# Patient Record
Sex: Female | Born: 1935 | Race: White | Hispanic: No | Smoking: Never smoker
Health system: Southern US, Community
[De-identification: ages and names within clinical notes are randomized; demographics above are authoritative.]

## PROBLEM LIST (undated history)

## (undated) DIAGNOSIS — E079 Disorder of thyroid, unspecified: Secondary | ICD-10-CM

## (undated) DIAGNOSIS — I639 Cerebral infarction, unspecified: Secondary | ICD-10-CM

## (undated) DIAGNOSIS — M1990 Unspecified osteoarthritis, unspecified site: Secondary | ICD-10-CM

## (undated) HISTORY — PX: ABDOMINAL HYSTERECTOMY: SHX81

---

## 1998-04-03 ENCOUNTER — Other Ambulatory Visit: Admission: RE | Admit: 1998-04-03 | Discharge: 1998-04-03 | Payer: Self-pay | Admitting: Obstetrics and Gynecology

## 2001-10-04 ENCOUNTER — Other Ambulatory Visit: Admission: RE | Admit: 2001-10-04 | Discharge: 2001-10-04 | Payer: Self-pay | Admitting: Obstetrics and Gynecology

## 2002-08-19 ENCOUNTER — Encounter: Admission: RE | Admit: 2002-08-19 | Discharge: 2002-08-19 | Payer: Self-pay | Admitting: Family Medicine

## 2002-08-19 ENCOUNTER — Encounter: Payer: Self-pay | Admitting: Family Medicine

## 2004-10-18 ENCOUNTER — Encounter: Admission: RE | Admit: 2004-10-18 | Discharge: 2004-10-18 | Payer: Self-pay | Admitting: Family Medicine

## 2004-11-19 ENCOUNTER — Encounter: Admission: RE | Admit: 2004-11-19 | Discharge: 2004-11-19 | Payer: Self-pay | Admitting: Family Medicine

## 2005-11-13 ENCOUNTER — Encounter: Admission: RE | Admit: 2005-11-13 | Discharge: 2005-11-13 | Payer: Self-pay | Admitting: Family Medicine

## 2005-12-04 ENCOUNTER — Encounter: Admission: RE | Admit: 2005-12-04 | Discharge: 2005-12-04 | Payer: Self-pay | Admitting: Family Medicine

## 2005-12-31 ENCOUNTER — Encounter: Admission: RE | Admit: 2005-12-31 | Discharge: 2005-12-31 | Payer: Self-pay | Admitting: Family Medicine

## 2006-05-28 ENCOUNTER — Encounter: Admission: RE | Admit: 2006-05-28 | Discharge: 2006-05-28 | Payer: Self-pay | Admitting: Family Medicine

## 2006-06-16 ENCOUNTER — Ambulatory Visit (HOSPITAL_COMMUNITY): Admission: RE | Admit: 2006-06-16 | Discharge: 2006-06-16 | Payer: Self-pay | Admitting: Family Medicine

## 2006-11-30 ENCOUNTER — Encounter: Admission: RE | Admit: 2006-11-30 | Discharge: 2006-11-30 | Payer: Self-pay | Admitting: Family Medicine

## 2016-01-13 ENCOUNTER — Emergency Department (HOSPITAL_COMMUNITY): Payer: Medicare Other

## 2016-01-13 ENCOUNTER — Encounter (HOSPITAL_COMMUNITY): Payer: Self-pay | Admitting: Emergency Medicine

## 2016-01-13 ENCOUNTER — Other Ambulatory Visit: Payer: Self-pay

## 2016-01-13 ENCOUNTER — Emergency Department (HOSPITAL_COMMUNITY)
Admission: EM | Admit: 2016-01-13 | Discharge: 2016-01-13 | Disposition: A | Discharge status: Home / Self Care | Payer: Medicare Other | Attending: Emergency Medicine | Admitting: Emergency Medicine

## 2016-01-13 DIAGNOSIS — Z79899 Other long term (current) drug therapy: Secondary | ICD-10-CM | POA: Insufficient documentation

## 2016-01-13 DIAGNOSIS — R079 Chest pain, unspecified: Secondary | ICD-10-CM | POA: Diagnosis not present

## 2016-01-13 HISTORY — DX: Disorder of thyroid, unspecified: E07.9

## 2016-01-13 HISTORY — DX: Unspecified osteoarthritis, unspecified site: M19.90

## 2016-01-13 LAB — BASIC METABOLIC PANEL
ANION GAP: 10 (ref 5–15)
BUN: 16 mg/dL (ref 6–20)
CALCIUM: 10 mg/dL (ref 8.9–10.3)
CHLORIDE: 103 mmol/L (ref 101–111)
CO2: 26 mmol/L (ref 22–32)
Creatinine, Ser: 1.16 mg/dL — ABNORMAL HIGH (ref 0.44–1.00)
GFR calc non Af Amer: 40 mL/min — ABNORMAL LOW (ref >60)
GFR, EST AFRICAN AMERICAN: 47 mL/min — AB (ref >60)
GLUCOSE: 121 mg/dL — AB (ref 65–99)
POTASSIUM: 4.2 mmol/L (ref 3.5–5.1)
Sodium: 139 mmol/L (ref 135–145)

## 2016-01-13 LAB — CBC
HEMATOCRIT: 44.4 % (ref 36.0–46.0)
HEMOGLOBIN: 14.7 g/dL (ref 12.0–15.0)
MCH: 32.2 pg (ref 26.0–34.0)
MCHC: 33.1 g/dL (ref 30.0–36.0)
MCV: 97.4 fL (ref 78.0–100.0)
Platelets: 317 10*3/uL (ref 150–400)
RBC: 4.56 MIL/uL (ref 3.87–5.11)
RDW: 13.4 % (ref 11.5–15.5)
WBC: 7.8 10*3/uL (ref 4.0–10.5)

## 2016-01-13 LAB — I-STAT TROPONIN, ED
TROPONIN I, POC: 0.01 ng/mL (ref 0.00–0.08)
Troponin i, poc: 0.01 ng/mL (ref 0.00–0.08)

## 2016-01-13 NOTE — ED Notes (Signed)
Patient given a mouth swab 

## 2016-01-13 NOTE — ED Notes (Signed)
PA at bedside.

## 2016-01-13 NOTE — Discharge Instructions (Signed)
It is very important that you keep your scheduled appointment with your primary care provider on Wednesday. Please let your doctor know about today's symptoms. Return to ER for shortness of breath, return of chest pain, nausea, abdominal pain, new or worsening symptoms, any additional concerns.

## 2016-01-13 NOTE — ED Provider Notes (Signed)
[facility]-EMERGENCY DEPT Provider Note   CSN: [id number] Arrival date & time: 01/13/16  [id number]  First Provider Contact:  First MD Initiated Contact with Patient 01/13/16 [phone]       History   Chief Complaint Chief Complaint  Patient presents with  . Chest Pain    HPI Lindsey Rivera is a 82 y.o. female.  Lindsey FetchDorothy C Cramer is a 82 y.o. female with a PMH of thyroid disease who presents to the Emergency Department complaining of central chest pain that began at 1am this morning. Patient reports that pain felt similar to her indigestion and she also experienced associated belching. Pain was intermittent, lasting a few seconds and occurring every 10-15 minutes. She has been pain free since approx. 3:30am. Denies shortness of breath, nausea, vomiting, abdominal pain, diaphoresis, or other associated symptoms. No medications taken prior to arrival for symptoms. No cardiac history, not a smoker.     The history is provided by the patient and medical records. No language interpreter was used.  Chest Pain   Pertinent negatives include no abdominal pain, no back pain, no cough, no diaphoresis, no fever, no headaches, no nausea, no palpitations, no shortness of breath and no vomiting.    Past Medical History:  Diagnosis Date  . Arthritis   . Thyroid disease     There are no active problems to display for this patient.   Past Surgical History:  Procedure Laterality Date  . ABDOMINAL HYSTERECTOMY      OB History    No data available       Home Medications    Prior to Admission medications   Medication Sig Start Date End Date Taking? Authorizing Provider  clorazepate (TRANXENE) 7.5 MG tablet Take 7.5 mg by mouth daily as needed for anxiety.   Yes Historical Provider, MD  FLUoxetine (PROZAC) 10 MG capsule Take 10 mg by mouth daily.   Yes Historical Provider, MD  levothyroxine (SYNTHROID, LEVOTHROID) 50 MCG tablet Take 50 mcg by mouth daily before breakfast.   Yes Historical Provider, MD    montelukast (SINGULAIR) 10 MG tablet Take 10 mg by mouth at bedtime.   Yes Historical Provider, MD  omega-3 acid ethyl esters (LOVAZA) 1 g capsule Take 1 g by mouth daily.   Yes Historical Provider, MD  OVER THE COUNTER MEDICATION Take 2 tablets by mouth daily. chelation   Yes Historical Provider, MD  propranolol (INDERAL) 10 MG tablet Take 10 mg by mouth daily as needed (hypertension).   Yes Historical Provider, MD  thiamine (VITAMIN B-1) 100 MG tablet Take 100 mg by mouth daily.   Yes Historical Provider, MD  TURMERIC PO Take 1 tablet by mouth daily.   Yes Historical Provider, MD    Family History History reviewed. No pertinent family history.  Social History Social History  Substance Use Topics  . Smoking status: Never Smoker  . Smokeless tobacco: Not on file  . Alcohol use No     Allergies   Darvon [propoxyphene] and Sulfa antibiotics   Review of Systems Review of Systems  Constitutional: Negative for diaphoresis and fever.  HENT: Negative for congestion.   Eyes: Negative for visual disturbance.  Respiratory: Negative for cough and shortness of breath.   Cardiovascular: Positive for chest pain. Negative for palpitations and leg swelling.  Gastrointestinal: Negative for abdominal pain, nausea and vomiting.  Genitourinary: Negative for dysuria.  Musculoskeletal: Negative for back pain and neck pain.  Skin: Negative for rash.  Neurological: Negative for headaches.  Physical Exam Updated Vital Signs BP 148/69   Pulse (!) 58   Temp 97.9 F (36.6 C) (Oral)   Resp 15   Ht  (1.575 m)   Wt 74.8 kg   SpO2 94%   BMI 30.18 kg/m   Physical Exam  Constitutional: She is oriented to person, place, and time. She appears well-developed and well-nourished. No distress.  HENT:  Head: Normocephalic and atraumatic.  Cardiovascular: Normal rate, regular rhythm, normal heart sounds and intact distal pulses.  Exam reveals no gallop and no friction rub.   No murmur  heard. Pulmonary/Chest: Effort normal and breath sounds normal. No respiratory distress. She has no wheezes. She has no rales. She exhibits no tenderness.  Abdominal: Soft. Bowel sounds are normal. She exhibits no distension. There is no tenderness.  Musculoskeletal: She exhibits no edema.  Neurological: She is alert and oriented to person, place, and time.  Skin: Skin is warm and dry.  Nursing note and vitals reviewed.    ED Treatments / Results  Labs (all labs ordered are listed, but only abnormal results are displayed) Labs Reviewed  BASIC METABOLIC PANEL - Abnormal; Notable for the following:       Result Value   Glucose, Bld 121 (*)    Creatinine, Ser 1.16 (*)    GFR calc non Af Amer 40 (*)    GFR calc Af Amer 47 (*)    All other components within normal limits  CBC  I-STAT TROPOININ, ED  I-STAT TROPOININ, ED    EKG  EKG Interpretation  Date/Time:  Sunday January 13 2016 08:02:00 EDT Ventricular Rate:  63 PR Interval:    QRS Duration: 81 QT Interval:  391 QTC Calculation: 401 R Axis:   -42 Text Interpretation:  Age not entered, assumed to be  82 years old for purpose of ECG interpretation Sinus rhythm Prolonged PR interval Left axis deviation Low voltage, precordial leads No significant change since last tracing Confirmed by ZACKOWSKI  MD, SCOTT [phone]) on 01/13/2016 8:04:48 AM       Radiology Dg Chest 2 View  Result Date: 01/13/2016 CLINICAL DATA:  82 year old female with chest pain EXAM: CHEST  2 VIEW COMPARISON:  Chest radiograph dated 12/29/2011 FINDINGS: Two views of the chest demonstrate emphysematous changes of the lungs with minimal bibasilar atelectatic changes. No focal consolidation, pleural effusion, or pneumothorax. The cardiac silhouette is within normal limits. No acute osseous pathology. IMPRESSION: No active cardiopulmonary disease. Electronically Signed   By: Elgie Collard M.D.   On: 01/13/2016 06:03    Procedures Procedures (including critical  care time)  Medications Ordered in ED Medications - No data to display   Initial Impression / Assessment and Plan / ED Course  I have reviewed the triage vital signs and the nursing notes.  Pertinent labs & imaging results that were available during my care of the patient were reviewed by me and considered in my medical decision making (see chart for details).  Clinical Course   Lindsey Rivera is a pleasant 82 y.o. female with PMH of thyroid disorder who presents to ER for chest pain that lasted a few seconds, coming-and-going every 10-15 minutes from 1am-3:30am this morning. No cardiac history, not a smoker, no HTN or DM. Initial EKG reviewed with Dr. Mora Bellman showing nonspecific changes - No prior EKG to compare. Initial trop negative. Labs reviewed. CXR with no acute cardiopulmonary disease. BP elevated -Not tachycardic or hypoxic. She appears very well and has been inquiring multiple times  throughout ED stay when she can go home. EKG was repeated with no significant change from previous. Informed patient that I would advise she stay for repeat troponin which she agrees too. Delta trop also unchanged. Results discussed with patient. HEART score of 4. Offered admission for chest pain rule out, however patient still requesting discharge to home. She has a PCP appointment already scheduled for Wednesday (3 days). Strongly encouraged patient to keep scheduled appointment. Patient given strict reasons to return to ED and voices understanding. Patient appears reliable for follow up and agrees to return to ED for further evaluation if chest pain returns, difficulty breathing, abdominal pain, n/v, back pain, new or worsening symptoms, any additional concerns.   Patient seen by and discussed with Dr. Eudelia Bunch who agrees with treatment plan.    Final Clinical Impressions(s) / ED Diagnoses   Final diagnoses:  Chest pain, unspecified chest pain type    New Prescriptions New Prescriptions   No  medications on file     [facility] Vernelle Wisner, PA-C 01/13/16 [address] [place], [place] 01/13/16 2112

## 2017-04-22 ENCOUNTER — Encounter (INDEPENDENT_AMBULATORY_CARE_PROVIDER_SITE_OTHER): Payer: Medicare Other | Admitting: Ophthalmology

## 2017-04-22 DIAGNOSIS — H353132 Nonexudative age-related macular degeneration, bilateral, intermediate dry stage: Secondary | ICD-10-CM

## 2017-04-22 DIAGNOSIS — H43813 Vitreous degeneration, bilateral: Secondary | ICD-10-CM | POA: Diagnosis not present

## 2017-05-06 ENCOUNTER — Encounter (INDEPENDENT_AMBULATORY_CARE_PROVIDER_SITE_OTHER): Payer: Medicare Other | Admitting: Ophthalmology

## 2017-05-06 DIAGNOSIS — H43813 Vitreous degeneration, bilateral: Secondary | ICD-10-CM

## 2017-05-06 DIAGNOSIS — H353132 Nonexudative age-related macular degeneration, bilateral, intermediate dry stage: Secondary | ICD-10-CM

## 2017-05-06 DIAGNOSIS — H4323 Crystalline deposits in vitreous body, bilateral: Secondary | ICD-10-CM | POA: Diagnosis not present

## 2017-06-04 ENCOUNTER — Emergency Department (HOSPITAL_COMMUNITY): Payer: No Typology Code available for payment source

## 2017-06-04 ENCOUNTER — Observation Stay (HOSPITAL_COMMUNITY): Payer: No Typology Code available for payment source

## 2017-06-04 ENCOUNTER — Other Ambulatory Visit (HOSPITAL_COMMUNITY): Payer: Medicare Other

## 2017-06-04 ENCOUNTER — Encounter (HOSPITAL_COMMUNITY): Payer: Self-pay | Admitting: Emergency Medicine

## 2017-06-04 ENCOUNTER — Inpatient Hospital Stay (HOSPITAL_COMMUNITY)
Admission: EM | Admit: 2017-06-04 | Discharge: 2017-06-06 | DRG: 064 | Disposition: A | Discharge status: Home Health | Payer: No Typology Code available for payment source | Attending: Internal Medicine | Admitting: Internal Medicine

## 2017-06-04 ENCOUNTER — Other Ambulatory Visit: Payer: Self-pay

## 2017-06-04 DIAGNOSIS — I129 Hypertensive chronic kidney disease with stage 1 through stage 4 chronic kidney disease, or unspecified chronic kidney disease: Secondary | ICD-10-CM | POA: Diagnosis present

## 2017-06-04 DIAGNOSIS — Z882 Allergy status to sulfonamides status: Secondary | ICD-10-CM

## 2017-06-04 DIAGNOSIS — R29700 NIHSS score 0: Secondary | ICD-10-CM | POA: Diagnosis present

## 2017-06-04 DIAGNOSIS — M4690 Unspecified inflammatory spondylopathy, site unspecified: Secondary | ICD-10-CM | POA: Diagnosis present

## 2017-06-04 DIAGNOSIS — Z90710 Acquired absence of both cervix and uterus: Secondary | ICD-10-CM

## 2017-06-04 DIAGNOSIS — N183 Chronic kidney disease, stage 3 unspecified: Secondary | ICD-10-CM | POA: Diagnosis present

## 2017-06-04 DIAGNOSIS — E039 Hypothyroidism, unspecified: Secondary | ICD-10-CM | POA: Diagnosis present

## 2017-06-04 DIAGNOSIS — E669 Obesity, unspecified: Secondary | ICD-10-CM | POA: Diagnosis present

## 2017-06-04 DIAGNOSIS — R4781 Slurred speech: Secondary | ICD-10-CM | POA: Diagnosis present

## 2017-06-04 DIAGNOSIS — R4182 Altered mental status, unspecified: Secondary | ICD-10-CM | POA: Diagnosis not present

## 2017-06-04 DIAGNOSIS — F0390 Unspecified dementia without behavioral disturbance: Secondary | ICD-10-CM | POA: Diagnosis present

## 2017-06-04 DIAGNOSIS — N179 Acute kidney failure, unspecified: Secondary | ICD-10-CM

## 2017-06-04 DIAGNOSIS — Z885 Allergy status to narcotic agent status: Secondary | ICD-10-CM

## 2017-06-04 DIAGNOSIS — G459 Transient cerebral ischemic attack, unspecified: Secondary | ICD-10-CM | POA: Diagnosis present

## 2017-06-04 DIAGNOSIS — Z6832 Body mass index (BMI) 32.0-32.9, adult: Secondary | ICD-10-CM

## 2017-06-04 DIAGNOSIS — M1990 Unspecified osteoarthritis, unspecified site: Secondary | ICD-10-CM | POA: Diagnosis present

## 2017-06-04 DIAGNOSIS — R402252 Coma scale, best verbal response, oriented, at arrival to emergency department: Secondary | ICD-10-CM | POA: Diagnosis present

## 2017-06-04 DIAGNOSIS — E785 Hyperlipidemia, unspecified: Secondary | ICD-10-CM | POA: Diagnosis present

## 2017-06-04 DIAGNOSIS — A419 Sepsis, unspecified organism: Secondary | ICD-10-CM

## 2017-06-04 DIAGNOSIS — Z79890 Hormone replacement therapy (postmenopausal): Secondary | ICD-10-CM

## 2017-06-04 DIAGNOSIS — I639 Cerebral infarction, unspecified: Secondary | ICD-10-CM | POA: Diagnosis not present

## 2017-06-04 DIAGNOSIS — S2231XA Fracture of one rib, right side, initial encounter for closed fracture: Secondary | ICD-10-CM | POA: Diagnosis present

## 2017-06-04 DIAGNOSIS — R402142 Coma scale, eyes open, spontaneous, at arrival to emergency department: Secondary | ICD-10-CM | POA: Diagnosis present

## 2017-06-04 DIAGNOSIS — F329 Major depressive disorder, single episode, unspecified: Secondary | ICD-10-CM | POA: Diagnosis present

## 2017-06-04 DIAGNOSIS — R29710 NIHSS score 10: Secondary | ICD-10-CM | POA: Diagnosis not present

## 2017-06-04 DIAGNOSIS — E079 Disorder of thyroid, unspecified: Secondary | ICD-10-CM | POA: Diagnosis present

## 2017-06-04 DIAGNOSIS — I63411 Cerebral infarction due to embolism of right middle cerebral artery: Secondary | ICD-10-CM | POA: Diagnosis not present

## 2017-06-04 DIAGNOSIS — G9341 Metabolic encephalopathy: Secondary | ICD-10-CM | POA: Diagnosis present

## 2017-06-04 DIAGNOSIS — R402362 Coma scale, best motor response, obeys commands, at arrival to emergency department: Secondary | ICD-10-CM | POA: Diagnosis present

## 2017-06-04 DIAGNOSIS — F419 Anxiety disorder, unspecified: Secondary | ICD-10-CM | POA: Diagnosis present

## 2017-06-04 LAB — I-STAT CHEM 8, ED
BUN: 13 mg/dL (ref 6–20)
CALCIUM ION: 1.22 mmol/L (ref 1.15–1.40)
CHLORIDE: 104 mmol/L (ref 101–111)
CREATININE: 0.9 mg/dL (ref 0.44–1.00)
GLUCOSE: 104 mg/dL — AB (ref 65–99)
HCT: 40 % (ref 36.0–46.0)
Hemoglobin: 13.6 g/dL (ref 12.0–15.0)
Potassium: 4 mmol/L (ref 3.5–5.1)
SODIUM: 145 mmol/L (ref 135–145)
TCO2: 27 mmol/L (ref 22–32)

## 2017-06-04 LAB — COMPREHENSIVE METABOLIC PANEL
ALK PHOS: 56 U/L (ref 38–126)
ALT: 17 U/L (ref 14–54)
ANION GAP: 10 (ref 5–15)
AST: 23 U/L (ref 15–41)
Albumin: 2.9 g/dL — ABNORMAL LOW (ref 3.5–5.0)
BILIRUBIN TOTAL: 0.5 mg/dL (ref 0.3–1.2)
BUN: 13 mg/dL (ref 6–20)
CALCIUM: 8.9 mg/dL (ref 8.9–10.3)
CO2: 25 mmol/L (ref 22–32)
CREATININE: 0.96 mg/dL (ref 0.44–1.00)
Chloride: 104 mmol/L (ref 101–111)
GFR calc non Af Amer: 51 mL/min — ABNORMAL LOW (ref >60)
GFR, EST AFRICAN AMERICAN: 59 mL/min — AB (ref >60)
Glucose, Bld: 109 mg/dL — ABNORMAL HIGH (ref 65–99)
Potassium: 3.9 mmol/L (ref 3.5–5.1)
SODIUM: 139 mmol/L (ref 135–145)
TOTAL PROTEIN: 6.5 g/dL (ref 6.5–8.1)

## 2017-06-04 LAB — URINALYSIS, ROUTINE W REFLEX MICROSCOPIC
BILIRUBIN URINE: NEGATIVE
Glucose, UA: NEGATIVE mg/dL
Hgb urine dipstick: NEGATIVE
KETONES UR: NEGATIVE mg/dL
LEUKOCYTES UA: NEGATIVE
NITRITE: NEGATIVE
Protein, ur: NEGATIVE mg/dL
SPECIFIC GRAVITY, URINE: 1.01 (ref 1.005–1.030)
pH: 5 (ref 5.0–8.0)

## 2017-06-04 LAB — I-STAT TROPONIN, ED: Troponin i, poc: 0.01 ng/mL (ref 0.00–0.08)

## 2017-06-04 LAB — CBC
HEMATOCRIT: 39.8 % (ref 36.0–46.0)
HEMOGLOBIN: 12.9 g/dL (ref 12.0–15.0)
MCH: 31.5 pg (ref 26.0–34.0)
MCHC: 32.4 g/dL (ref 30.0–36.0)
MCV: 97.1 fL (ref 78.0–100.0)
PLATELETS: 283 10*3/uL (ref 150–400)
RBC: 4.1 MIL/uL (ref 3.87–5.11)
RDW: 14 % (ref 11.5–15.5)
WBC: 9.8 10*3/uL (ref 4.0–10.5)

## 2017-06-04 LAB — DIFFERENTIAL
Basophils Absolute: 0.1 10*3/uL (ref 0.0–0.1)
Basophils Relative: 1 %
EOS PCT: 2 %
Eosinophils Absolute: 0.2 10*3/uL (ref 0.0–0.7)
LYMPHS ABS: 2 10*3/uL (ref 0.7–4.0)
LYMPHS PCT: 21 %
MONO ABS: 0.7 10*3/uL (ref 0.1–1.0)
MONOS PCT: 8 %
NEUTROS ABS: 6.7 10*3/uL (ref 1.7–7.7)
Neutrophils Relative %: 68 %

## 2017-06-04 LAB — RAPID URINE DRUG SCREEN, HOSP PERFORMED
AMPHETAMINES: NOT DETECTED
Barbiturates: NOT DETECTED
Benzodiazepines: POSITIVE — AB
Cocaine: NOT DETECTED
Opiates: NOT DETECTED
TETRAHYDROCANNABINOL: NOT DETECTED

## 2017-06-04 LAB — ETHANOL: Alcohol, Ethyl (B): <10 mg/dL (ref <10)

## 2017-06-04 LAB — PHOSPHORUS: PHOSPHORUS: 3.3 mg/dL (ref 2.5–4.6)

## 2017-06-04 LAB — MAGNESIUM: Magnesium: 2.1 mg/dL (ref 1.7–2.4)

## 2017-06-04 LAB — APTT: aPTT: 28 seconds (ref 24–36)

## 2017-06-04 LAB — PROTIME-INR
INR: 0.96
Prothrombin Time: 12.7 seconds (ref 11.4–15.2)

## 2017-06-04 LAB — TSH: TSH: 3.397 u[IU]/mL (ref 0.350–4.500)

## 2017-06-04 MED ORDER — ENOXAPARIN SODIUM 40 MG/0.4ML ~~LOC~~ SOLN
40.0000 mg | SUBCUTANEOUS | Status: DC
  Filled 2017-06-04: qty 0.4

## 2017-06-04 MED ORDER — TRAMADOL HCL 50 MG PO TABS: 50.0000 mg | ORAL_TABLET | Freq: Two times a day (BID) | ORAL | Status: DC | PRN

## 2017-06-04 MED ORDER — OMEGA-3-ACID ETHYL ESTERS 1 G PO CAPS: 1.0000 g | ORAL_CAPSULE | Freq: Every day | ORAL | Status: DC

## 2017-06-04 MED ORDER — ASPIRIN 325 MG PO TABS
325.0000 mg | ORAL_TABLET | Freq: Every day | ORAL | Status: DC
  Administered 2017-06-04 – 2017-06-06 (×3): 325 mg via ORAL
  Filled 2017-06-04 (×3): qty 1

## 2017-06-04 MED ORDER — ACETAMINOPHEN 650 MG RE SUPP: 650.0000 mg | Freq: Four times a day (QID) | RECTAL | Status: DC | PRN

## 2017-06-04 MED ORDER — ACETAMINOPHEN 325 MG PO TABS: 650.0000 mg | ORAL_TABLET | Freq: Four times a day (QID) | ORAL | Status: DC | PRN

## 2017-06-04 MED ORDER — ONDANSETRON HCL 4 MG/2ML IJ SOLN: 4.0000 mg | Freq: Four times a day (QID) | INTRAMUSCULAR | Status: DC | PRN

## 2017-06-04 MED ORDER — VITAMIN B-1 100 MG PO TABS: 100.0000 mg | ORAL_TABLET | Freq: Every day | ORAL | Status: DC

## 2017-06-04 MED ORDER — IOPAMIDOL (ISOVUE-370) INJECTION 76%
INTRAVENOUS | Status: AC
  Administered 2017-06-04: 50 mL
  Filled 2017-06-04: qty 50

## 2017-06-04 MED ORDER — KETOROLAC TROMETHAMINE 15 MG/ML IJ SOLN
15.0000 mg | Freq: Three times a day (TID) | INTRAMUSCULAR | Status: DC
  Administered 2017-06-04 – 2017-06-06 (×6): 15 mg via INTRAVENOUS
  Filled 2017-06-04 (×6): qty 1

## 2017-06-04 MED ORDER — ASPIRIN 300 MG RE SUPP: 300.0000 mg | Freq: Every day | RECTAL | Status: DC

## 2017-06-04 MED ORDER — LEVOTHYROXINE SODIUM 50 MCG PO TABS
50.0000 ug | ORAL_TABLET | Freq: Every day | ORAL | Status: DC
  Administered 2017-06-05 – 2017-06-06 (×2): 50 ug via ORAL
  Filled 2017-06-04 (×2): qty 1

## 2017-06-04 MED ORDER — MONTELUKAST SODIUM 10 MG PO TABS: 10.0000 mg | ORAL_TABLET | Freq: Every day | ORAL | Status: DC

## 2017-06-04 MED ORDER — FLUOXETINE HCL 10 MG PO CAPS
10.0000 mg | ORAL_CAPSULE | Freq: Every day | ORAL | Status: DC
  Administered 2017-06-05 – 2017-06-06 (×2): 10 mg via ORAL
  Filled 2017-06-04 (×2): qty 1

## 2017-06-04 MED ORDER — LIDOCAINE 5 % EX PTCH
1.0000 | MEDICATED_PATCH | CUTANEOUS | Status: DC
Start: 2017-06-04 — End: 2017-06-06
  Administered 2017-06-04 – 2017-06-05 (×2): 1 via TRANSDERMAL
  Filled 2017-06-04 (×2): qty 1

## 2017-06-04 MED ORDER — TRAMADOL HCL 50 MG PO TABS: 50.0000 mg | ORAL_TABLET | Freq: Two times a day (BID) | ORAL | Status: DC

## 2017-06-04 MED ORDER — PROPRANOLOL HCL 10 MG PO TABS: 10.0000 mg | ORAL_TABLET | Freq: Every day | ORAL | Status: DC | PRN

## 2017-06-04 MED ORDER — ONDANSETRON HCL 4 MG PO TABS: 4.0000 mg | ORAL_TABLET | Freq: Four times a day (QID) | ORAL | Status: DC | PRN

## 2017-06-04 MED ORDER — SODIUM CHLORIDE 0.9% FLUSH
3.0000 mL | Freq: Two times a day (BID) | INTRAVENOUS | Status: DC
  Administered 2017-06-04 – 2017-06-06 (×5): 3 mL via INTRAVENOUS

## 2017-06-04 MED ORDER — PANTOPRAZOLE SODIUM 40 MG IV SOLR
40.0000 mg | INTRAVENOUS | Status: DC
  Administered 2017-06-04: 40 mg via INTRAVENOUS
  Filled 2017-06-04: qty 40

## 2017-06-04 MED ORDER — STROKE: EARLY STAGES OF RECOVERY BOOK: Freq: Once | Status: DC
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
## 2017-06-04 NOTE — ED Notes (Signed)
Patient transported to MRI 

## 2017-06-04 NOTE — ED Notes (Signed)
Spoke with son -- Peyton NajjarLarry -- [phone]-
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
## 2017-06-04 NOTE — ED Notes (Signed)
Patient in Xray at this time.
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
## 2017-06-04 NOTE — Progress Notes (Signed)
Pt arrived to 3W16 via stretcher.  Pt alert and oriented, VSS.  Will continue to monitor.  Sondra ComeSilva, Rebbecca Osuna M, RN

## 2017-06-05 ENCOUNTER — Observation Stay (HOSPITAL_COMMUNITY): Payer: No Typology Code available for payment source

## 2017-06-05 ENCOUNTER — Other Ambulatory Visit: Payer: Self-pay | Admitting: Cardiology

## 2017-06-05 DIAGNOSIS — I129 Hypertensive chronic kidney disease with stage 1 through stage 4 chronic kidney disease, or unspecified chronic kidney disease: Secondary | ICD-10-CM | POA: Diagnosis present

## 2017-06-05 DIAGNOSIS — R29710 NIHSS score 10: Secondary | ICD-10-CM | POA: Diagnosis not present

## 2017-06-05 DIAGNOSIS — N183 Chronic kidney disease, stage 3 (moderate): Secondary | ICD-10-CM | POA: Diagnosis not present

## 2017-06-05 DIAGNOSIS — R402362 Coma scale, best motor response, obeys commands, at arrival to emergency department: Secondary | ICD-10-CM | POA: Diagnosis present

## 2017-06-05 DIAGNOSIS — G9341 Metabolic encephalopathy: Secondary | ICD-10-CM | POA: Diagnosis present

## 2017-06-05 DIAGNOSIS — M1990 Unspecified osteoarthritis, unspecified site: Secondary | ICD-10-CM

## 2017-06-05 DIAGNOSIS — Z885 Allergy status to narcotic agent status: Secondary | ICD-10-CM | POA: Diagnosis not present

## 2017-06-05 DIAGNOSIS — E079 Disorder of thyroid, unspecified: Secondary | ICD-10-CM | POA: Diagnosis not present

## 2017-06-05 DIAGNOSIS — F419 Anxiety disorder, unspecified: Secondary | ICD-10-CM | POA: Diagnosis present

## 2017-06-05 DIAGNOSIS — R29700 NIHSS score 0: Secondary | ICD-10-CM | POA: Diagnosis present

## 2017-06-05 DIAGNOSIS — I63511 Cerebral infarction due to unspecified occlusion or stenosis of right middle cerebral artery: Secondary | ICD-10-CM | POA: Diagnosis not present

## 2017-06-05 DIAGNOSIS — F0390 Unspecified dementia without behavioral disturbance: Secondary | ICD-10-CM | POA: Diagnosis present

## 2017-06-05 DIAGNOSIS — E039 Hypothyroidism, unspecified: Secondary | ICD-10-CM | POA: Diagnosis present

## 2017-06-05 DIAGNOSIS — M4690 Unspecified inflammatory spondylopathy, site unspecified: Secondary | ICD-10-CM | POA: Diagnosis present

## 2017-06-05 DIAGNOSIS — Z882 Allergy status to sulfonamides status: Secondary | ICD-10-CM | POA: Diagnosis not present

## 2017-06-05 DIAGNOSIS — S2231XA Fracture of one rib, right side, initial encounter for closed fracture: Secondary | ICD-10-CM | POA: Diagnosis present

## 2017-06-05 DIAGNOSIS — I63411 Cerebral infarction due to embolism of right middle cerebral artery: Secondary | ICD-10-CM | POA: Diagnosis present

## 2017-06-05 DIAGNOSIS — E785 Hyperlipidemia, unspecified: Secondary | ICD-10-CM | POA: Diagnosis not present

## 2017-06-05 DIAGNOSIS — G459 Transient cerebral ischemic attack, unspecified: Secondary | ICD-10-CM

## 2017-06-05 DIAGNOSIS — I340 Nonrheumatic mitral (valve) insufficiency: Secondary | ICD-10-CM

## 2017-06-05 DIAGNOSIS — E669 Obesity, unspecified: Secondary | ICD-10-CM | POA: Diagnosis present

## 2017-06-05 DIAGNOSIS — I639 Cerebral infarction, unspecified: Secondary | ICD-10-CM

## 2017-06-05 DIAGNOSIS — Z6832 Body mass index (BMI) 32.0-32.9, adult: Secondary | ICD-10-CM | POA: Diagnosis not present

## 2017-06-05 DIAGNOSIS — R402142 Coma scale, eyes open, spontaneous, at arrival to emergency department: Secondary | ICD-10-CM | POA: Diagnosis present

## 2017-06-05 DIAGNOSIS — R4781 Slurred speech: Secondary | ICD-10-CM | POA: Diagnosis present

## 2017-06-05 DIAGNOSIS — R4182 Altered mental status, unspecified: Secondary | ICD-10-CM | POA: Diagnosis present

## 2017-06-05 DIAGNOSIS — Z90710 Acquired absence of both cervix and uterus: Secondary | ICD-10-CM | POA: Diagnosis not present

## 2017-06-05 DIAGNOSIS — F329 Major depressive disorder, single episode, unspecified: Secondary | ICD-10-CM | POA: Diagnosis present

## 2017-06-05 DIAGNOSIS — R402252 Coma scale, best verbal response, oriented, at arrival to emergency department: Secondary | ICD-10-CM | POA: Diagnosis present

## 2017-06-05 DIAGNOSIS — Z79890 Hormone replacement therapy (postmenopausal): Secondary | ICD-10-CM | POA: Diagnosis not present

## 2017-06-05 LAB — COMPREHENSIVE METABOLIC PANEL
ALK PHOS: 51 U/L (ref 38–126)
ALT: 17 U/L (ref 14–54)
ANION GAP: 8 (ref 5–15)
AST: 25 U/L (ref 15–41)
Albumin: 2.8 g/dL — ABNORMAL LOW (ref 3.5–5.0)
BUN: 14 mg/dL (ref 6–20)
CALCIUM: 8.9 mg/dL (ref 8.9–10.3)
CHLORIDE: 104 mmol/L (ref 101–111)
CO2: 26 mmol/L (ref 22–32)
Creatinine, Ser: 1.22 mg/dL — ABNORMAL HIGH (ref 0.44–1.00)
GFR calc non Af Amer: 38 mL/min — ABNORMAL LOW (ref >60)
GFR, EST AFRICAN AMERICAN: 44 mL/min — AB (ref >60)
Glucose, Bld: 95 mg/dL (ref 65–99)
Potassium: 3.7 mmol/L (ref 3.5–5.1)
SODIUM: 138 mmol/L (ref 135–145)
Total Bilirubin: 0.7 mg/dL (ref 0.3–1.2)
Total Protein: 6 g/dL — ABNORMAL LOW (ref 6.5–8.1)

## 2017-06-05 LAB — CBC
HEMATOCRIT: 38.5 % (ref 36.0–46.0)
HEMOGLOBIN: 12.6 g/dL (ref 12.0–15.0)
MCH: 31.6 pg (ref 26.0–34.0)
MCHC: 32.7 g/dL (ref 30.0–36.0)
MCV: 96.5 fL (ref 78.0–100.0)
Platelets: 278 10*3/uL (ref 150–400)
RBC: 3.99 MIL/uL (ref 3.87–5.11)
RDW: 14 % (ref 11.5–15.5)
WBC: 7.9 10*3/uL (ref 4.0–10.5)

## 2017-06-05 LAB — LIPID PANEL
CHOL/HDL RATIO: 4.9 ratio
Cholesterol: 235 mg/dL — ABNORMAL HIGH (ref 0–200)
HDL: 48 mg/dL (ref >40)
LDL CALC: 165 mg/dL — AB (ref 0–99)
Triglycerides: 109 mg/dL (ref <150)
VLDL: 22 mg/dL (ref 0–40)

## 2017-06-05 LAB — ECHOCARDIOGRAM COMPLETE
HEIGHTINCHES: 60 in
WEIGHTICAEL: 2640 [oz_av]

## 2017-06-05 LAB — HEMOGLOBIN A1C
Hgb A1c MFr Bld: 5.7 % — ABNORMAL HIGH (ref 4.8–5.6)
MEAN PLASMA GLUCOSE: 116.89 mg/dL

## 2017-06-05 MED ORDER — ATORVASTATIN CALCIUM 40 MG PO TABS
40.0000 mg | ORAL_TABLET | Freq: Every day | ORAL | Status: DC
  Administered 2017-06-05: 40 mg via ORAL
  Filled 2017-06-05: qty 1

## 2017-06-05 MED ORDER — ENOXAPARIN SODIUM 30 MG/0.3ML ~~LOC~~ SOLN
30.0000 mg | SUBCUTANEOUS | Status: DC
  Administered 2017-06-05: 30 mg via SUBCUTANEOUS
  Filled 2017-06-05: qty 0.3

## 2017-06-05 MED ORDER — PANTOPRAZOLE SODIUM 40 MG PO TBEC
40.0000 mg | DELAYED_RELEASE_TABLET | Freq: Every day | ORAL | Status: DC
  Administered 2017-06-05 – 2017-06-06 (×2): 40 mg via ORAL
  Filled 2017-06-05 (×2): qty 1

## 2017-06-05 MED ORDER — CLOPIDOGREL BISULFATE 75 MG PO TABS
75.0000 mg | ORAL_TABLET | Freq: Every day | ORAL | Status: DC
  Administered 2017-06-05 – 2017-06-06 (×2): 75 mg via ORAL
  Filled 2017-06-05 (×2): qty 1
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
## 2017-06-05 NOTE — Progress Notes (Signed)
PT/OT recommending HH services. Offered the patient choice and [facility] selected. Cory with [facility] updated. [facility].  Pt states her daughter and son can provide 24/7 supervision at home. CM spoke to patients daughter, Bernita BuffyDianna, and she was in agreement that they are able to provide supervision at the patients home.  CM following.

## 2017-06-05 NOTE — Care Management Note (Signed)
Case Management Note  Patient Details  Name: Lindsey Rivera MRN: [id number] Date of Birth: 08/24/1924  Subjective/Objective:      Pt admitted with CVA. She is from home alone.               Action/Plan: PT recommending HH services. Awaiting OT eval. CM following for d/c needs, physician orders.   Expected Discharge Date:                  Expected Discharge Plan:  Home w Home Health Services  In-House Referral:     Discharge planning Services  CM Consult  Post Acute Care Choice:    Choice offered to:     DME Arranged:    DME Agency:     HH Arranged:    HH Agency:     Status of Service:  In process, will continue to follow  If discussed at Long Length of Stay Meetings, dates discussed:    Additional Comments:  Kermit BaloKelli F Dalene Robards, RN 06/05/2017, 11:07 AM

## 2017-06-05 NOTE — Progress Notes (Signed)
  Echocardiogram 2D Echocardiogram has been performed.  Lindsey Rivera Lindsey Rivera 06/05/2017, 1:51 PM
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
## 2017-06-05 NOTE — Progress Notes (Signed)
CONSULT NOTE  Pharmacy Consult for home medications with medication interactions   Allergies  Allergen Reactions  . Darvon [Propoxyphene] Other (See Comments)    unknown  . Sulfa Antibiotics Palpitations    Patient Measurements: Height: 5' (152.4 cm) Weight: 165 lb (74.8 kg) IBW/kg (Calculated) : 45.5   Vital Signs: Temp: 98 F (36.7 C) (12/28 0815) Temp Source: Oral (12/28 0815) BP: 133/55 (12/28 0815) Pulse Rate: 86 (12/28 0815)  Labs: Recent Labs    06/04/17 0945 06/04/17 1002 06/05/17 0417  HGB 12.9 13.6 12.6  HCT 39.8 40.0 38.5  PLT 283  --  278  APTT 28  --   --   LABPROT 12.7  --   --   INR 0.96  --   --   CREATININE 0.96 0.90 1.22*    Estimated Creatinine Clearance: 27.7 mL/min (A) (by C-G formula based on SCr of 1.22 mg/dL (H)).   Medications:  Medications Prior to Admission  Medication Sig Dispense Refill Last Dose  . calcium-vitamin D (OSCAL WITH D) 500-200 MG-UNIT tablet Take 1 tablet by mouth daily with breakfast.   06/04/2017 at Unknown   . clorazepate (TRANXENE) 7.5 MG tablet Take 7.5 mg by mouth daily as needed for anxiety.   06/04/2017 at Unknown   . FLUoxetine (PROZAC) 10 MG capsule Take 10 mg by mouth daily.   06/04/2017 at Unknown time  . Glucosamine-Chondroitin (OSTEO BI-FLEX REGULAR STRENGTH PO) Take 1 tablet by mouth daily.   Past Week at Unknown time  . levothyroxine (SYNTHROID, LEVOTHROID) 50 MCG tablet Take 50 mcg by mouth daily before breakfast.   06/04/2017 at Unknown time  . montelukast (SINGULAIR) 10 MG tablet Take 10 mg by mouth at bedtime.   06/04/2017 at Unknown   . Multiple Vitamins-Minerals (PRESERVISION AREDS PO) Take 1 tablet by mouth daily.   06/04/2017 at Unknown   . Omega-3 Fatty Acids (FISH OIL OMEGA-3 PO) Take 1 capsule by mouth.   06/04/2017 at Unknown time  . propranolol (INDERAL) 10 MG tablet Take 10 mg by mouth daily as needed (anxiety).   Past Week at Unknown time  . thiamine 100 MG tablet Take 100 mg by mouth  daily.   06/04/2017 at Unknown time  . timolol (BETIMOL) 0.5 % ophthalmic solution Place 1 drop into the left eye daily.   06/04/2017 at Unknown time  . TURMERIC PO Take 1 tablet by mouth daily.   06/04/2017 at Unknown time   Scheduled:  .  stroke: mapping our early stages of recovery book   Does not apply Once  . aspirin  300 mg Rectal Daily   Or  . aspirin  325 mg Oral Daily  . atorvastatin  40 mg Oral q1800  . enoxaparin (LOVENOX) injection  40 mg Subcutaneous Q24H  . FLUoxetine  10 mg Oral Daily  . ketorolac  15 mg Intravenous Q8H  . levothyroxine  50 mcg Oral QAC breakfast  . lidocaine  1 patch Transdermal Q24H  . pantoprazole  40 mg Oral Daily  . sodium chloride flush  3 mL Intravenous Q12H    Assessment: 83 yo female with CVA. Pharmacy was consulted to review the medication list with the patient and to look into drug interactions -The medication list has been updated after speaking with the patient (see the above list) -The following interactions were noted  Tumeric: may enhance the adverse/toxic effect of antiplatelet agents Synthroid: Separate the doses of the thyroid product and the oral calcium supplement by at least  4 hours    Plan:  -Consider restarting clorazepate, Singulair and vitamins as needed -Would avoid use of Tumeric  -Discussed with MD  Harland GermanAndrew Pablo Mathurin, Pharm D 06/05/2017 10:42 AM
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
## 2017-06-06 ENCOUNTER — Other Ambulatory Visit: Payer: Self-pay

## 2017-06-06 DIAGNOSIS — I63511 Cerebral infarction due to unspecified occlusion or stenosis of right middle cerebral artery: Secondary | ICD-10-CM

## 2017-06-06 DIAGNOSIS — I639 Cerebral infarction, unspecified: Secondary | ICD-10-CM

## 2017-06-06 MED ORDER — CLOPIDOGREL BISULFATE 75 MG PO TABS: 75.0000 mg | ORAL_TABLET | Freq: Every day | ORAL | 0 refills | Status: AC

## 2017-06-06 MED ORDER — ATORVASTATIN CALCIUM 40 MG PO TABS: 40.0000 mg | ORAL_TABLET | Freq: Every day | ORAL | 0 refills | Status: AC

## 2017-06-06 MED ORDER — ASPIRIN 325 MG PO TABS: 325.0000 mg | ORAL_TABLET | Freq: Every day | ORAL | 0 refills | Status: AC
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
## 2017-06-06 NOTE — Progress Notes (Signed)
Patient ready for discharge to home; discharge instructions given and reviewed;Rx's sent electronically. Patient dressed and assisted to the wheelchair; patient discharged out accompanied by her son and daughter-in-law.

## 2017-06-06 NOTE — Care Management (Addendum)
Sp[facility] with [facility] regarding [facility].  He does not feel pt needs PT, OT, or RN.  He does feel pt would benefit from SLP.  Cory with [facility] updated.  [facility]  PT eval will likely be Monday and SLP 1st visit will be Thursday.  Family updated.  Peyton NajjarLarry does not feel pt needs 3in1/shower chair.  Peyton NajjarLarry and wife will be with pt for 1 week, until PCP f/u.  He states they will decide at that point what further care she needs with PCP.

## 2017-06-08 ENCOUNTER — Encounter (HOSPITAL_COMMUNITY): Payer: Self-pay

## 2017-06-08 ENCOUNTER — Inpatient Hospital Stay (HOSPITAL_COMMUNITY)
Admission: EM | Admit: 2017-06-08 | Discharge: 2017-06-11 | DRG: 065 | Disposition: A | Discharge status: Skilled Nursing Facility | Payer: Medicare Other | Attending: Internal Medicine | Admitting: Internal Medicine

## 2017-06-08 ENCOUNTER — Emergency Department (HOSPITAL_COMMUNITY): Payer: Medicare Other

## 2017-06-08 DIAGNOSIS — R29710 NIHSS score 10: Secondary | ICD-10-CM | POA: Diagnosis present

## 2017-06-08 DIAGNOSIS — N183 Chronic kidney disease, stage 3 unspecified: Secondary | ICD-10-CM | POA: Diagnosis present

## 2017-06-08 DIAGNOSIS — I639 Cerebral infarction, unspecified: Secondary | ICD-10-CM | POA: Diagnosis present

## 2017-06-08 DIAGNOSIS — Z7982 Long term (current) use of aspirin: Secondary | ICD-10-CM

## 2017-06-08 DIAGNOSIS — R29810 Facial weakness: Secondary | ICD-10-CM | POA: Diagnosis present

## 2017-06-08 DIAGNOSIS — F419 Anxiety disorder, unspecified: Secondary | ICD-10-CM | POA: Diagnosis present

## 2017-06-08 DIAGNOSIS — I63411 Cerebral infarction due to embolism of right middle cerebral artery: Principal | ICD-10-CM | POA: Diagnosis present

## 2017-06-08 DIAGNOSIS — R531 Weakness: Secondary | ICD-10-CM

## 2017-06-08 DIAGNOSIS — H5340 Unspecified visual field defects: Secondary | ICD-10-CM | POA: Diagnosis present

## 2017-06-08 DIAGNOSIS — I6381 Other cerebral infarction due to occlusion or stenosis of small artery: Secondary | ICD-10-CM | POA: Diagnosis present

## 2017-06-08 DIAGNOSIS — R4701 Aphasia: Secondary | ICD-10-CM | POA: Diagnosis present

## 2017-06-08 DIAGNOSIS — Z66 Do not resuscitate: Secondary | ICD-10-CM | POA: Diagnosis present

## 2017-06-08 DIAGNOSIS — G8194 Hemiplegia, unspecified affecting left nondominant side: Secondary | ICD-10-CM | POA: Diagnosis present

## 2017-06-08 DIAGNOSIS — R414 Neurologic neglect syndrome: Secondary | ICD-10-CM | POA: Diagnosis present

## 2017-06-08 DIAGNOSIS — G460 Middle cerebral artery syndrome: Secondary | ICD-10-CM | POA: Diagnosis present

## 2017-06-08 DIAGNOSIS — I10 Essential (primary) hypertension: Secondary | ICD-10-CM | POA: Diagnosis present

## 2017-06-08 DIAGNOSIS — I671 Cerebral aneurysm, nonruptured: Secondary | ICD-10-CM | POA: Diagnosis present

## 2017-06-08 DIAGNOSIS — I672 Cerebral atherosclerosis: Secondary | ICD-10-CM | POA: Diagnosis present

## 2017-06-08 DIAGNOSIS — R1310 Dysphagia, unspecified: Secondary | ICD-10-CM | POA: Diagnosis present

## 2017-06-08 DIAGNOSIS — I951 Orthostatic hypotension: Secondary | ICD-10-CM | POA: Diagnosis present

## 2017-06-08 DIAGNOSIS — F329 Major depressive disorder, single episode, unspecified: Secondary | ICD-10-CM | POA: Diagnosis present

## 2017-06-08 DIAGNOSIS — G9340 Encephalopathy, unspecified: Secondary | ICD-10-CM | POA: Diagnosis present

## 2017-06-08 DIAGNOSIS — I6622 Occlusion and stenosis of left posterior cerebral artery: Secondary | ICD-10-CM | POA: Diagnosis present

## 2017-06-08 DIAGNOSIS — E039 Hypothyroidism, unspecified: Secondary | ICD-10-CM | POA: Diagnosis present

## 2017-06-08 DIAGNOSIS — R410 Disorientation, unspecified: Secondary | ICD-10-CM

## 2017-06-08 DIAGNOSIS — I129 Hypertensive chronic kidney disease with stage 1 through stage 4 chronic kidney disease, or unspecified chronic kidney disease: Secondary | ICD-10-CM | POA: Diagnosis present

## 2017-06-08 DIAGNOSIS — Z7902 Long term (current) use of antithrombotics/antiplatelets: Secondary | ICD-10-CM

## 2017-06-08 DIAGNOSIS — Z79890 Hormone replacement therapy (postmenopausal): Secondary | ICD-10-CM

## 2017-06-08 HISTORY — DX: Cerebral infarction, unspecified: I63.9

## 2017-06-08 LAB — DIFFERENTIAL
Basophils Absolute: 0 10*3/uL (ref 0.0–0.1)
Basophils Relative: 0 %
EOS ABS: 0.2 10*3/uL (ref 0.0–0.7)
EOS PCT: 2 %
LYMPHS ABS: 2.6 10*3/uL (ref 0.7–4.0)
Lymphocytes Relative: 27 %
MONOS PCT: 16 %
Monocytes Absolute: 1.5 10*3/uL — ABNORMAL HIGH (ref 0.1–1.0)
Neutro Abs: 5.1 10*3/uL (ref 1.7–7.7)
Neutrophils Relative %: 55 %

## 2017-06-08 LAB — I-STAT CHEM 8, ED
BUN: 30 mg/dL — AB (ref 6–20)
CALCIUM ION: 1.22 mmol/L (ref 1.15–1.40)
CHLORIDE: 104 mmol/L (ref 101–111)
Creatinine, Ser: 1.2 mg/dL — ABNORMAL HIGH (ref 0.44–1.00)
GLUCOSE: 112 mg/dL — AB (ref 65–99)
HCT: 37 % (ref 36.0–46.0)
Hemoglobin: 12.6 g/dL (ref 12.0–15.0)
Potassium: 4 mmol/L (ref 3.5–5.1)
Sodium: 140 mmol/L (ref 135–145)
TCO2: 26 mmol/L (ref 22–32)

## 2017-06-08 LAB — COMPREHENSIVE METABOLIC PANEL
ALT: 22 U/L (ref 14–54)
ANION GAP: 7 (ref 5–15)
AST: 28 U/L (ref 15–41)
Albumin: 3.1 g/dL — ABNORMAL LOW (ref 3.5–5.0)
Alkaline Phosphatase: 54 U/L (ref 38–126)
BILIRUBIN TOTAL: 1 mg/dL (ref 0.3–1.2)
BUN: 30 mg/dL — AB (ref 6–20)
CO2: 27 mmol/L (ref 22–32)
Calcium: 9.1 mg/dL (ref 8.9–10.3)
Chloride: 105 mmol/L (ref 101–111)
Creatinine, Ser: 1.23 mg/dL — ABNORMAL HIGH (ref 0.44–1.00)
GFR calc Af Amer: 43 mL/min — ABNORMAL LOW (ref >60)
GFR, EST NON AFRICAN AMERICAN: 37 mL/min — AB (ref >60)
Glucose, Bld: 116 mg/dL — ABNORMAL HIGH (ref 65–99)
POTASSIUM: 4 mmol/L (ref 3.5–5.1)
Sodium: 139 mmol/L (ref 135–145)
TOTAL PROTEIN: 6.9 g/dL (ref 6.5–8.1)

## 2017-06-08 LAB — CBG MONITORING, ED: Glucose-Capillary: 100 mg/dL — ABNORMAL HIGH (ref 65–99)

## 2017-06-08 LAB — CBC
HEMATOCRIT: 40.3 % (ref 36.0–46.0)
HEMOGLOBIN: 13.6 g/dL (ref 12.0–15.0)
MCH: 32.2 pg (ref 26.0–34.0)
MCHC: 33.7 g/dL (ref 30.0–36.0)
MCV: 95.5 fL (ref 78.0–100.0)
Platelets: 274 10*3/uL (ref 150–400)
RBC: 4.22 MIL/uL (ref 3.87–5.11)
RDW: 13.6 % (ref 11.5–15.5)
WBC: 9.4 10*3/uL (ref 4.0–10.5)

## 2017-06-08 LAB — I-STAT TROPONIN, ED: TROPONIN I, POC: 0.04 ng/mL (ref 0.00–0.08)

## 2017-06-08 LAB — PROTIME-INR
INR: 0.97
Prothrombin Time: 12.8 seconds (ref 11.4–15.2)

## 2017-06-08 LAB — APTT: aPTT: 22 seconds — ABNORMAL LOW (ref 24–36)

## 2017-06-08 MED ORDER — IOPAMIDOL (ISOVUE-370) INJECTION 76%
INTRAVENOUS | Status: AC
  Filled 2017-06-08: qty 100

## 2017-06-08 MED ORDER — IOPAMIDOL (ISOVUE-370) INJECTION 76%
60.0000 mL | Freq: Once | INTRAVENOUS | Status: AC | PRN
  Administered 2017-06-08: 60 mL via INTRAVENOUS

## 2017-06-08 NOTE — ED Notes (Signed)
Telestroke RN signing off

## 2017-06-08 NOTE — ED Notes (Signed)
Bed: [id number] Expected date:  Expected time:  Means of arrival:  Comments: EMS 83 yo female weakness hx stroke
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
## 2017-06-08 NOTE — Consult Note (Addendum)
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 She is not a candidate for NIR for the R M1 segment as this process started well over 24 hours ago. However, if she has a new clot in the left MCA territory, she may be a NIR candidate. Recommend repeat CTA/P head/neck. - - -   Not a tpa candidate due to: LKN >4.5 hours Presentation is not suggestive of Large Vessel Occlusive Disease. Thrombectomy would not be recommended.   Differential Diagnosis:   1. Cardioembolic stroke  2. Small vessel disease/lacune  3. Thromboembolic, artery-to-artery mechanism  4. Hypercoagulable state-related infarct  5. Transient ischemic attack  6. Thrombotic mechanism, large artery disease   Comments:   Door time: 2032 [facility] contacted: [phone] [facility] at bedside: 2217 NIHSS assessment time: 2234  Recommendations:  CTA/P head/neck if neg for new findings, recommend admission for evolving stroke management. Continue ASA/plavix DVT proph - lovenox permissive htn PT/OT/speech bedside swallow eval  Inpatient neurology consultation Inpatient stroke evaluation as per Neurology/ Internal Medicine Discussed with ED MD Please call with questions  -----------------------------------------------------------------------------------------  CC L sided weakness.  History of Present Illness   Patient is a 83 yo woman who was admitted last week for an acute R BG and R posterior frontal lobe infarcts. She was was discharged yesterday. This evening, her son noted difficulty speaking and left facial droop. She was discharged on plavix and ASA but refused the plavix tonight. She was LSN at approx 1600. No LOC/convulsion. After the stroke last week, she had residual left sided  weakness, but it is worse today.   Diagnostic: MRI shows R BG infarct; CTA head/neck shows a proximal R M1 occlusion vs severe stenosis.   Exam:  RESULT SUMMARY: 9 points NIH Stroke Scale   INPUTS: 1A: Level of consciousness -> 1 = Arouses to minor stimulation 1B: Ask month and age -> 2 = 0 questions right  1C: 'Blink eyes' & 'squeeze hands' -> 0 = Performs both tasks 2: Horizontal extraocular movements -> 1 = Partial gaze palsy: can be overcome 3: Visual fields -> 0 = No visual loss 4: Facial palsy -> 0 = Normal symmetry 5A: Left arm motor drift -> 0 = No drift for 10 seconds 5B: Right arm motor drift -> 0 = No drift for 10 seconds 6A: Left leg motor drift -> 1 = Drift, but doesn't hit bed 6B: Right leg motor drift -> 0 = No drift for 5 seconds 7: Limb Ataxia -> 0 = No ataxia 8: Sensation -> 1 = Mild-moderate loss: less sharp/more dull  9: Language/aphasia -> 2 = Severe aphasia: fragmentary expression, inference needed, cannot identify materials 10: Dysarthria -> 1 = Mild-moderate dysarthria: slurring but can be understood 11: Extinction/inattention -> 0 = No abnormality  Medical Decision Making:  - Extensive number of diagnosis or management options are considered above.   - Extensive amount of complex data reviewed.   - High risk of complication and/or morbidity or mortality are associated with differential diagnostic considerations above.  - There may be Uncertain outcome and increased probability of prolonged functional impairment or high probability of severe prolonged functional impairment associated with some of these differential diagnosis.  Medical Data Reviewed:  1.Data reviewed include clinical labs, radiology,  Medical Tests;   2.Tests  results discussed w/performing or interpreting physician;   3.Obtaining/reviewing old medical records;  4.Obtaining case history from another source;  5.Independent review of image, tracing or specimen.    Patient was informed the  Neurology Consult would happen via telehealth (remote video) and consented to receiving care in this manner.

## 2017-06-08 NOTE — ED Notes (Signed)
Patient attempted to use bedpan but was unsuccessful due to difficulty with following directions. Purewick to be placed for patient.

## 2017-06-08 NOTE — ED Notes (Signed)
Pt son and dtr in law arrived and are bedside. Pt Son reports that pt went to bed around 4pm and woke up around 6 pm and pt was increased confusion, could not mover per pt son, and had facial drooping to left side of face.
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
## 2017-06-08 NOTE — ED Notes (Signed)
Patient transported to CT 

## 2017-06-09 ENCOUNTER — Other Ambulatory Visit: Payer: Self-pay

## 2017-06-09 DIAGNOSIS — F419 Anxiety disorder, unspecified: Secondary | ICD-10-CM

## 2017-06-09 DIAGNOSIS — R29810 Facial weakness: Secondary | ICD-10-CM | POA: Diagnosis present

## 2017-06-09 DIAGNOSIS — I63411 Cerebral infarction due to embolism of right middle cerebral artery: Secondary | ICD-10-CM | POA: Diagnosis present

## 2017-06-09 DIAGNOSIS — R1310 Dysphagia, unspecified: Secondary | ICD-10-CM | POA: Diagnosis present

## 2017-06-09 DIAGNOSIS — I10 Essential (primary) hypertension: Secondary | ICD-10-CM

## 2017-06-09 DIAGNOSIS — I671 Cerebral aneurysm, nonruptured: Secondary | ICD-10-CM | POA: Diagnosis present

## 2017-06-09 DIAGNOSIS — I639 Cerebral infarction, unspecified: Secondary | ICD-10-CM

## 2017-06-09 DIAGNOSIS — R4701 Aphasia: Secondary | ICD-10-CM | POA: Diagnosis present

## 2017-06-09 DIAGNOSIS — G8194 Hemiplegia, unspecified affecting left nondominant side: Secondary | ICD-10-CM

## 2017-06-09 DIAGNOSIS — R414 Neurologic neglect syndrome: Secondary | ICD-10-CM | POA: Diagnosis present

## 2017-06-09 DIAGNOSIS — R531 Weakness: Secondary | ICD-10-CM | POA: Diagnosis present

## 2017-06-09 DIAGNOSIS — G9340 Encephalopathy, unspecified: Secondary | ICD-10-CM | POA: Diagnosis present

## 2017-06-09 DIAGNOSIS — N183 Chronic kidney disease, stage 3 (moderate): Secondary | ICD-10-CM | POA: Diagnosis present

## 2017-06-09 DIAGNOSIS — E038 Other specified hypothyroidism: Secondary | ICD-10-CM

## 2017-06-09 DIAGNOSIS — I6622 Occlusion and stenosis of left posterior cerebral artery: Secondary | ICD-10-CM | POA: Diagnosis present

## 2017-06-09 DIAGNOSIS — E039 Hypothyroidism, unspecified: Secondary | ICD-10-CM | POA: Diagnosis present

## 2017-06-09 DIAGNOSIS — R410 Disorientation, unspecified: Secondary | ICD-10-CM | POA: Diagnosis not present

## 2017-06-09 DIAGNOSIS — H5340 Unspecified visual field defects: Secondary | ICD-10-CM | POA: Diagnosis present

## 2017-06-09 DIAGNOSIS — Z7982 Long term (current) use of aspirin: Secondary | ICD-10-CM | POA: Diagnosis not present

## 2017-06-09 DIAGNOSIS — I951 Orthostatic hypotension: Secondary | ICD-10-CM | POA: Diagnosis present

## 2017-06-09 DIAGNOSIS — R29710 NIHSS score 10: Secondary | ICD-10-CM | POA: Diagnosis present

## 2017-06-09 DIAGNOSIS — Z66 Do not resuscitate: Secondary | ICD-10-CM | POA: Diagnosis present

## 2017-06-09 DIAGNOSIS — Z79890 Hormone replacement therapy (postmenopausal): Secondary | ICD-10-CM | POA: Diagnosis not present

## 2017-06-09 DIAGNOSIS — Z7902 Long term (current) use of antithrombotics/antiplatelets: Secondary | ICD-10-CM | POA: Diagnosis not present

## 2017-06-09 DIAGNOSIS — I129 Hypertensive chronic kidney disease with stage 1 through stage 4 chronic kidney disease, or unspecified chronic kidney disease: Secondary | ICD-10-CM | POA: Diagnosis present

## 2017-06-09 DIAGNOSIS — I6381 Other cerebral infarction due to occlusion or stenosis of small artery: Secondary | ICD-10-CM | POA: Diagnosis present

## 2017-06-09 DIAGNOSIS — I672 Cerebral atherosclerosis: Secondary | ICD-10-CM | POA: Diagnosis present

## 2017-06-09 DIAGNOSIS — G460 Middle cerebral artery syndrome: Secondary | ICD-10-CM | POA: Diagnosis present

## 2017-06-09 DIAGNOSIS — F329 Major depressive disorder, single episode, unspecified: Secondary | ICD-10-CM | POA: Diagnosis present

## 2017-06-09 MED ORDER — HEPARIN SODIUM (PORCINE) 5000 UNIT/ML IJ SOLN
5000.0000 [IU] | Freq: Two times a day (BID) | INTRAMUSCULAR | Status: DC
  Administered 2017-06-09 – 2017-06-11 (×4): 5000 [IU] via SUBCUTANEOUS
  Filled 2017-06-09 (×4): qty 1

## 2017-06-09 MED ORDER — ACETAMINOPHEN 650 MG RE SUPP: 650.0000 mg | RECTAL | Status: DC | PRN

## 2017-06-09 MED ORDER — FLUOXETINE HCL 10 MG PO CAPS
10.0000 mg | ORAL_CAPSULE | Freq: Every day | ORAL | Status: DC
  Administered 2017-06-09 – 2017-06-11 (×3): 10 mg via ORAL
  Filled 2017-06-09 (×3): qty 1

## 2017-06-09 MED ORDER — ACETAMINOPHEN 325 MG PO TABS
650.0000 mg | ORAL_TABLET | ORAL | Status: DC | PRN
Start: 2017-06-09 — End: 2017-06-11

## 2017-06-09 MED ORDER — ASPIRIN 325 MG PO TABS
325.0000 mg | ORAL_TABLET | Freq: Every day | ORAL | Status: DC
  Administered 2017-06-09 – 2017-06-11 (×3): 325 mg via ORAL
  Filled 2017-06-09 (×3): qty 1

## 2017-06-09 MED ORDER — CLOPIDOGREL BISULFATE 75 MG PO TABS
75.0000 mg | ORAL_TABLET | Freq: Every day | ORAL | Status: DC
  Administered 2017-06-09 – 2017-06-11 (×3): 75 mg via ORAL
  Filled 2017-06-09 (×3): qty 1

## 2017-06-09 MED ORDER — SENNOSIDES-DOCUSATE SODIUM 8.6-50 MG PO TABS: 1.0000 | ORAL_TABLET | Freq: Every evening | ORAL | Status: DC | PRN

## 2017-06-09 MED ORDER — LEVOTHYROXINE SODIUM 50 MCG PO TABS
50.0000 ug | ORAL_TABLET | Freq: Every day | ORAL | Status: DC
  Administered 2017-06-10 – 2017-06-11 (×2): 50 ug via ORAL
  Filled 2017-06-09 (×2): qty 1

## 2017-06-09 MED ORDER — ACETAMINOPHEN 160 MG/5ML PO SOLN: 650.0000 mg | ORAL | Status: DC | PRN

## 2017-06-09 MED ORDER — SODIUM CHLORIDE 0.9 % IV SOLN
INTRAVENOUS | Status: DC
  Administered 2017-06-09: 18:00:00 via INTRAVENOUS

## 2017-06-09 MED ORDER — MONTELUKAST SODIUM 10 MG PO TABS
10.0000 mg | ORAL_TABLET | Freq: Every day | ORAL | Status: DC
  Administered 2017-06-09 – 2017-06-10 (×2): 10 mg via ORAL
  Filled 2017-06-09 (×2): qty 1

## 2017-06-09 MED ORDER — TIMOLOL MALEATE 0.5 % OP SOLN
1.0000 [drp] | Freq: Every day | OPHTHALMIC | Status: DC
  Administered 2017-06-09 – 2017-06-11 (×3): 1 [drp] via OPHTHALMIC
  Filled 2017-06-09: qty 5

## 2017-06-09 MED ORDER — ATORVASTATIN CALCIUM 40 MG PO TABS
40.0000 mg | ORAL_TABLET | Freq: Every day | ORAL | Status: DC
  Administered 2017-06-09 – 2017-06-10 (×2): 40 mg via ORAL
  Filled 2017-06-09 (×2): qty 1

## 2017-06-09 MED ORDER — STROKE: EARLY STAGES OF RECOVERY BOOK
Freq: Once | Status: AC
  Administered 2017-06-10: 10:00:00

## 2017-06-09 NOTE — Consult Note (Signed)
Referring Physician: Dr. Darrick Meigs    Chief Complaint: Left leg weakness, left facial droop and slurred speech   HPI: Lindsey Rivera is an 84 y.o. female who was recently discharged from the hospital on 12/29 after stroke work up. Her initial presentation was with slurred speech and some left sided numbness. MRI had revealed a right basal ganglia CVA and stenosis of M1 on the right. She had been discharged with plan to be on ASA/Plavix for 3 months, followed by switch to Plavix monotherapy. She re-presented on 12/31 with what per family was a new left facial droop, left leg weakness and aphasia starting at about 6:30 pm that day.  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 Teleneurology consultant determined that she was not a tPA candidate due to LKN > 4.5 hours. Additionally, she had recently had a stroke which presented risk for hemorrhagic conversion. Teleneurology also determined that her presentation was not suggestive of LVO and that therefore thrombectomy would not be recommended.   Past Medical History:  Diagnosis Date  . Arthritis   . Stroke ([place])   . Thyroid disease     Past Surgical History:  Procedure Laterality Date  . ABDOMINAL HYSTERECTOMY      No family history on file. Social History:  reports that  has never smoked. she has never used smokeless tobacco. She reports that she does not drink alcohol or use drugs.  Allergies:  Allergies  Allergen Reactions  . Darvon [Propoxyphene] Other (See Comments)    unknown  . Sulfa Antibiotics Palpitations    Medications:  Prior to Admission:  Medications Prior to Admission  Medication Sig Dispense Refill Last Dose  . aspirin 325 MG tablet Take 1 tablet (325 mg total) by mouth daily. 30 tablet 0 06/08/2017 at Unknown time  . atorvastatin (LIPITOR)  40 MG tablet Take 1 tablet (40 mg total) by mouth daily at 6 PM. 30 tablet 0 06/07/2017 at Unknown time  . calcium-vitamin D (OSCAL WITH D) 500-200 MG-UNIT tablet Take 1 tablet by mouth daily with breakfast.   06/08/2017 at Unknown time  . clopidogrel (PLAVIX) 75 MG tablet Take 1 tablet (75 mg total) by mouth daily. 30 tablet 0 06/07/2017 at 600 PM  . clorazepate (TRANXENE) 7.5 MG tablet Take 7.5 mg by mouth daily as needed for anxiety.   06/08/2017 at Unknown time  . FLUoxetine (PROZAC) 10 MG capsule Take 10 mg by mouth daily.   06/08/2017 at Unknown time  . Glucosamine-Chondroitin (OSTEO BI-FLEX REGULAR STRENGTH PO) Take 1 tablet by mouth daily.   06/08/2017 at Unknown time  . levothyroxine (SYNTHROID, LEVOTHROID) 50 MCG tablet Take 50 mcg by mouth daily before breakfast.   06/08/2017 at Unknown time  . montelukast (SINGULAIR) 10 MG tablet Take 10 mg by mouth at bedtime.   06/07/2017 at Unknown time  . Multiple Vitamins-Minerals (PRESERVISION AREDS PO) Take 1 tablet by mouth 2 (two) times daily.    06/08/2017 at Unknown time  . Omega-3 Fatty Acids (FISH OIL OMEGA-3 PO) Take 1 capsule by mouth.   06/08/2017 at Unknown time  . prednisoLONE acetate (PRED FORTE) 1 % ophthalmic suspension Place 1 drop into the left eye every 2 (two) hours while awake.   06/07/2017  . propranolol (INDERAL) 10 MG tablet Take 10 mg by mouth daily as needed (anxiety).  06/08/2017 at Unknown time  . thiamine 100 MG tablet Take 100 mg by mouth daily.   06/08/2017 at Unknown time  . timolol (BETIMOL) 0.5 % ophthalmic solution Place 1 drop into the left eye daily.   06/08/2017 at Unknown time   Scheduled: .  stroke: mapping our early stages of recovery book   Does not apply Once  . aspirin  325 mg Oral Daily  . atorvastatin  40 mg Oral q1800  . clopidogrel  75 mg Oral Daily  . FLUoxetine  10 mg Oral Daily  . heparin  5,000 Units Subcutaneous Q12H  . levothyroxine  50 mcg Oral QAC breakfast  . montelukast  10 mg Oral QHS   . timolol  1 drop Left Eye Daily   Continuous: . sodium chloride 50 mL/hr at 06/09/17 1800   [id number] **OR** acetaminophen (TYLENOL) oral liquid 160 mg/5 mL **OR** acetaminophen, senna-docusate  ROS: As per HPI. The patient does not provide additional information at time of consult due to drowsiness.    Physical Examination: Blood pressure (!) 150/72, pulse 72, temperature 98.3 F (36.8 C), temperature source Oral, resp. rate 18, SpO2 96 %.  HEENT: [place]/AT Lungs: Respirations unlabored Ext: No edema  Neurologic Examination: Mental Status: Initially asleep, the patient awakens to a drowsy state. The patient continues to be drowsy and largely uncooperative at time of assessment. She is non-agitated. Oriented to self, year and location but not day of week. Initial speech output is nonfluent and nonsensical, then normalizes. Able to follow simple motor commands, but often ignores commands, requiring them to be repeated.  Cranial Nerves: II, III,IV, VI: Does not cooperate with visual exam. Keeps eyes closed. V,VII: Left facial droop noted. Does not cooperate with sensory exam VIII: hearing intact to voice IX,X: No hoarseness noted XI: Lag on the left XII: Mild dysarthria Motor: Lifts each leg in the air to command, right is normal, left with a lag and decreased elevation. Lifts each arm in the air to command, right is normal, left with a lag and decreased elevation. Grips examiner's hand, right stronger than left.  Sensory: Uncooperative Deep Tendon Reflexes:  Hypoactive upper extremities.  Absent patellae and achilles bilaterally.  Plantars: Equivocal bilaterally Cerebellar: Non-cooperative.  Gait: Non-cooperative.    Results for orders placed or performed during the hospital encounter of 06/08/17 (from the past 48 hour(s))  Protime-INR     Status: None   Collection Time: 06/08/17  9:21 PM  Result Value Ref Range   Prothrombin Time 12.8 11.4 - 15.2 seconds   INR 0.97    APTT     Status: Abnormal   Collection Time: 06/08/17  9:21 PM  Result Value Ref Range   aPTT 22 (L) 24 - 36 seconds  CBC     Status: None   Collection Time: 06/08/17  9:21 PM  Result Value Ref Range   WBC 9.4 4.0 - 10.5 K/uL   RBC 4.22 3.87 - 5.11 MIL/uL   Hemoglobin 13.6 12.0 - 15.0 g/dL   HCT 40.3 36.0 - 46.0 %   MCV 95.5 78.0 - 100.0 fL   MCH 32.2 26.0 - 34.0 pg   MCHC 33.7 30.0 - 36.0 g/dL   RDW 13.6 11.5 - 15.5 %   Platelets 274 150 - 400 K/uL  Differential     Status: Abnormal   Collection Time: 06/08/17  9:21 PM  Result Value Ref Range   Neutrophils Relative % 55 %   Neutro Abs 5.1 1.7 - 7.7  K/uL   Lymphocytes Relative 27 %   Lymphs Abs 2.6 0.7 - 4.0 K/uL   Monocytes Relative 16 %   Monocytes Absolute 1.5 (H) 0.1 - 1.0 K/uL   Eosinophils Relative 2 %   Eosinophils Absolute 0.2 0.0 - 0.7 K/uL   Basophils Relative 0 %   Basophils Absolute 0.0 0.0 - 0.1 K/uL  Comprehensive metabolic panel     Status: Abnormal   Collection Time: 06/08/17  9:21 PM  Result Value Ref Range   Sodium 139 135 - 145 mmol/L   Potassium 4.0 3.5 - 5.1 mmol/L   Chloride 105 101 - 111 mmol/L   CO2 27 22 - 32 mmol/L   Glucose, Bld 116 (H) 65 - 99 mg/dL   BUN 30 (H) 6 - 20 mg/dL   Creatinine, Ser 1.23 (H) 0.44 - 1.00 mg/dL   Calcium 9.1 8.9 - 10.3 mg/dL   Total Protein 6.9 6.5 - 8.1 g/dL   Albumin 3.1 (L) 3.5 - 5.0 g/dL   AST 28 15 - 41 U/L   ALT 22 14 - 54 U/L   Alkaline Phosphatase 54 38 - 126 U/L   Total Bilirubin 1.0 0.3 - 1.2 mg/dL   GFR calc non Af Amer 37 (L) >60 mL/min   GFR calc Af Amer 43 (L) >60 mL/min    Comment: (NOTE) The eGFR has been calculated using the CKD EPI equation. This calculation has not been validated in all clinical situations. eGFR's persistently <60 mL/min signify possible Chronic Kidney Disease.    Anion gap 7 5 - 15  I-stat troponin, ED     Status: None   Collection Time: 06/08/17  9:35 PM  Result Value Ref Range   Troponin i, poc 0.04 0.00 - 0.08 ng/mL    Comment 3            Comment: Due to the release kinetics of cTnI, a negative result within the first hours of the onset of symptoms does not rule out myocardial infarction with certainty. If myocardial infarction is still suspected, repeat the test at appropriate intervals.   I-Stat Chem 8, ED     Status: Abnormal   Collection Time: 06/08/17  9:36 PM  Result Value Ref Range   Sodium 140 135 - 145 mmol/L   Potassium 4.0 3.5 - 5.1 mmol/L   Chloride 104 101 - 111 mmol/L   BUN 30 (H) 6 - 20 mg/dL   Creatinine, Ser 1.20 (H) 0.44 - 1.00 mg/dL   Glucose, Bld 112 (H) 65 - 99 mg/dL   Calcium, Ion 1.22 1.15 - 1.40 mmol/L   TCO2 26 22 - 32 mmol/L   Hemoglobin 12.6 12.0 - 15.0 g/dL   HCT 37.0 36.0 - 46.0 %  CBG monitoring, ED     Status: Abnormal   Collection Time: 06/08/17  9:57 PM  Result Value Ref Range   Glucose-Capillary 100 (H) 65 - 99 mg/dL   Comment 1 Notify RN    Ct Angio Head W Or Wo Contrast  Result Date: 06/08/2017 CLINICAL DATA:  84 y/o  F; code stroke for follow-up. EXAM: CT ANGIOGRAPHY HEAD AND NECK TECHNIQUE: Multidetector CT imaging of the head and neck was performed using the standard protocol during bolus administration of intravenous contrast. Multiplanar CT image reconstructions and MIPs were obtained to evaluate the vascular anatomy. Carotid stenosis measurements (when applicable) are obtained utilizing NASCET criteria, using the distal internal carotid diameter as the denominator. CONTRAST:  29m ISOVUE-370 IOPAMIDOL (ISOVUE-370) INJECTION 76%  COMPARISON:  06/08/2017 CT head. 06/04/2017 CT angiogram head and neck. FINDINGS: CTA NECK FINDINGS Aortic arch: Standard branching. Imaged portion shows no evidence of aneurysm or dissection. No significant stenosis of the major arch vessel origins. Mild calcific atherosclerosis. Right carotid system: No evidence of dissection, stenosis (50% or greater) or occlusion. Mild non stenotic calcific atherosclerosis of carotid siphon.  Left carotid system: No evidence of dissection, stenosis (50% or greater) or occlusion. Mild non stenotic calcific atherosclerosis of carotid siphon. Severe stenosis of left external carotid origin. Vertebral arteries: Codominant. No evidence of dissection, stenosis (50% or greater) or occlusion. Motion related left vertebral artery origin. Skeleton: Moderate cervical spondylosis with prominent facet arthropathy. No high-grade canal stenosis. Other neck: Negative. Upper chest: Negative. Review of the MIP images confirms the above findings CTA HEAD FINDINGS Anterior circulation: Stable right proximal M1 occlusion within mid right M1 reconstitution. No new large vessel occlusion identified. Calcific atherosclerosis of carotid siphons without significant stenosis. Stable left ICA aneurysm of distal cavernous segment projecting superiorly and laterally measuring 4 mm to dome and 4 mm in diameter (series 9, image 92). Posterior circulation: Severe stenosis of left fetal PCA and left P2 segment. Patent bilateral vertebral and basilar arteries. Patent right posterior cerebral artery. Venous sinuses: As permitted by contrast timing, patent. Anatomic variants: Patent right posterior communicating artery. Fetal left PCA. Delayed phase: No abnormal intracranial enhancement. Stable distribution of infarct in right anterior basal ganglia. Review of the MIP images confirms the above findings IMPRESSION: 1. Stable proximal right M1 occlusion with mid right M1 reconstitution. 2. Stable tandem left PCA segments of severe stenosis. Fetal left PCA. 3. 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 4. No new large vessel occlusion, high-grade stenosis, or aneurysm of the anterior or posterior intracranial circulation. 5. Patent carotid and vertebral arteries. No dissection, aneurysm, or significant stenosis by NASCET criteria is identified. Electronically Signed   By: Kristine Garbe M.D.   On: 06/08/2017 23:41   Ct Angio  Neck W And/or Wo Contrast  Result Date: 06/08/2017 CLINICAL DATA:  84 y/o  F; code stroke for follow-up. EXAM: CT ANGIOGRAPHY HEAD AND NECK TECHNIQUE: Multidetector CT imaging of the head and neck was performed using the standard protocol during bolus administration of intravenous contrast. Multiplanar CT image reconstructions and MIPs were obtained to evaluate the vascular anatomy. Carotid stenosis measurements (when applicable) are obtained utilizing NASCET criteria, using the distal internal carotid diameter as the denominator. CONTRAST:  18m ISOVUE-370 IOPAMIDOL (ISOVUE-370) INJECTION 76% COMPARISON:  06/08/2017 CT head. 06/04/2017 CT angiogram head and neck. FINDINGS: CTA NECK FINDINGS Aortic arch: Standard branching. Imaged portion shows no evidence of aneurysm or dissection. No significant stenosis of the major arch vessel origins. Mild calcific atherosclerosis. Right carotid system: No evidence of dissection, stenosis (50% or greater) or occlusion. Mild non stenotic calcific atherosclerosis of carotid siphon. Left carotid system: No evidence of dissection, stenosis (50% or greater) or occlusion. Mild non stenotic calcific atherosclerosis of carotid siphon. Severe stenosis of left external carotid origin. Vertebral arteries: Codominant. No evidence of dissection, stenosis (50% or greater) or occlusion. Motion related left vertebral artery origin. Skeleton: Moderate cervical spondylosis with prominent facet arthropathy. No high-grade canal stenosis. Other neck: Negative. Upper chest: Negative. Review of the MIP images confirms the above findings CTA HEAD FINDINGS Anterior circulation: Stable right proximal M1 occlusion within mid right M1 reconstitution. No new large vessel occlusion identified. Calcific atherosclerosis of carotid siphons without significant stenosis. Stable left ICA aneurysm of distal cavernous  segment projecting superiorly and laterally measuring 4 mm to dome and 4 mm in diameter (series  9, image 92). Posterior circulation: Severe stenosis of left fetal PCA and left P2 segment. Patent bilateral vertebral and basilar arteries. Patent right posterior cerebral artery. Venous sinuses: As permitted by contrast timing, patent. Anatomic variants: Patent right posterior communicating artery. Fetal left PCA. Delayed phase: No abnormal intracranial enhancement. Stable distribution of infarct in right anterior basal ganglia. Review of the MIP images confirms the above findings IMPRESSION: 1. Stable proximal right M1 occlusion with mid right M1 reconstitution. 2. Stable tandem left PCA segments of severe stenosis. Fetal left PCA. 3. 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 4. No new large vessel occlusion, high-grade stenosis, or aneurysm of the anterior or posterior intracranial circulation. 5. Patent carotid and vertebral arteries. No dissection, aneurysm, or significant stenosis by NASCET criteria is identified. Electronically Signed   By: Kristine Garbe M.D.   On: 06/08/2017 23:41   Ct Head Code Stroke Wo Contrast  Result Date: 06/08/2017 CLINICAL DATA:  Code stroke. Left facial droop and left arm weakness. Code stroke. Recent infarct. Progressive symptoms. EXAM: CT HEAD WITHOUT CONTRAST TECHNIQUE: Contiguous axial images were obtained from the base of the skull through the vertex without intravenous contrast. COMPARISON:  CT head without contrast 06/04/2017. CTA head and neck and MRI head 06/04/2017. FINDINGS: Brain: The previous scratched at the recent nonhemorrhagic infarct involving many anterior basal ganglia on the right demonstrates expected evolution. The insular cortex is intact. The posterior basal ganglia are within normal limits. No other acute or focal cortical abnormalities are present. White matter changes are stable. There is no hemorrhage into the infarct. The brainstem and cerebellum are stable. Atrophy is unchanged. There is progressive effacement of the ventral horn  of the right lateral ventricle compatible with edema associated with evolving infarct. The ventricles are otherwise stable. No significant extra-axial fluid collection is present. Vascular: Atherosclerotic calcifications are again seen within the cavernous internal carotid artery is bilaterally. There is no hyperdense vessel. Skull: Calvarium is intact. No focal lytic or blastic lesions are present. Sinuses/Orbits: The paranasal sinuses and mastoid air cells are clear. Bilateral lens replacements are present. Globes and orbits are within normal limits. Scratched ASPECTS [facility] Stroke Program Early CT Score) - Ganglionic level infarction (caudate, lentiform nuclei, internal capsule, insula, M1-M3 cortex): 7/7 - Supraganglionic infarction (M4-M6 cortex): 3/3 Total score (0-10 with 10 being normal): 10/10 IMPRESSION: 1. Expected evolution of recent infarct involving the anterior right basal ganglia. 2. No new infarct. 3. Otherwise stable atrophy and white matter disease. 4. ASPECTS is 10/10 These results were called by telephone at the time of interpretation on 06/08/2017 at 9:49 pm to Dr. Milton Ferguson , who verbally acknowledged these results. Electronically Signed   By: San Morelle M.D.   On: 06/08/2017 21:51    Assessment: 84 y.o. female with recent right basal ganglia stroke secondary to proximal right M1 occlusion with mid-right M1 reconstitution. Re-presents with left leg weakness, left facial droop and slurred speech.  1. CT head revealed expected evolution of recent infarct involving the anterior right basal ganglia. No new infarct seen.  2. CTA head and neck: Stable proximal right M1 occlusion with mid right M1 reconstitution. Stable tandem left PCA segments of severe stenosis. Fetal left PCA. No new large vessel occlusion, high-grade stenosis, or aneurysm of the anterior or posterior intracranial circulation. Patent carotid and vertebral arteries. No dissection or significant stenosis by  NASCET criteria is  identified. 3. 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 Teleneurology consultant determined that she was not a tPA candidate due to LKN > 4.5 hours. Additionally, she had recently had a stroke which presented risk for hemorrhagic conversion. Teleneurology also determined that her presentation was not suggestive of LVO and that therefore thrombectomy would not be recommended.   Plan: 1. MRI, MRA of the brain without contrast 2. PT consult, OT consult, Speech consult 3. Continue ASA, Plavix and atorvastatin 4. Telemetry monitoring 5. Frequent neuro checks  '@Electronically'$  signed: Dr. Kerney Elbe  06/09/2017, 7:43 PM

## 2017-06-09 NOTE — Progress Notes (Addendum)
Subjective: Patient admitted this morning, see detailed H&P by Dr Arlean HoppingSchertz  84 y.o. 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  Was seen by neurology and dc'd on 12/29 on asa/ plavix for 3 mos then plavix alone.  Now pt presenting with L facial droop, L leg weak and aphasia started about 6:30 pm today.  LKN was 4 pm.  CT scan shows likely evolution of recent infarct in R basal ganglia, but no new infarct.  Seen by teleneurology, not TPA candidate.  CT angio showed no new large vessel occlusion. Await[facility].    Vitals:   06/09/17 1000 06/09/17 1030  BP: (!) 149/62 (!) 157/76  Pulse: 70 72  Resp: (!) 21 (!) 24  Temp:    SpO2: 96% 96%   neuro- continues to have aphasia, left hemiparesis   A/P Acute CVA MRI brain on 12 2718 showed acute infarct right basal ganglia CTA head and neck showed no acute vascular occlusion, high-grade stenosis or aneurysm.  [facility]   Meredeth IdeGagan S Lama [facility] Pager- [phone]

## 2017-06-09 NOTE — Progress Notes (Signed)
Patient admitted to room 326. Vital signs stable. No family present. Denies pain. Left side weakness present. Patient resting in bed at this time. Will continue to monitor closely.

## 2017-06-09 NOTE — Evaluation (Signed)
Clinical/Bedside Swallow Evaluation Patient Details  Name: Lindsey Rivera MRN: [id number] Date of Birth: 10/21/1924  Today's Date: 06/09/2017 Time: SLP Start Time (ACUTE ONLY): 0930 SLP Stop Time (ACUTE ONLY): 1015 SLP Time Calculation (min) (ACUTE ONLY): 45 min  Past Medical History:  Past Medical History:  Diagnosis Date  . Arthritis   . Stroke (HCC)   . Thyroid disease    Past Surgical History:  Past Surgical History:  Procedure Laterality Date  . ABDOMINAL HYSTERECTOMY     HPI:  Patient is a 84 y.o. female with recent admission for slurred speech and left-sided numbness from right basal ganglia CVA. She was discharged on 12/29. Currently, patient is presenting with left facial droop, left leg weakness, and aphasia which reportedly started about 6:30pm on 12/31. CT scan showed "likely evolution of recent infarct in R basal ganglia, but no new infarct.   Assessment / Plan / Recommendation Clinical Impression  Patient presents with a mild oral and mild-moderate pharyngeal dysphagia, with questionable esophageal component as patient did exhibit very mild and infrequent belching. Patient exhibited swallow initiation delays and throat clearing and cough response with thin liquids, however did not exhibit any overt s/s of aspiration or penetration with nectar thick liquids, puree solids, or regular texture solids. Patient with some awareness to the fact that thin liquids made her cough but nectar thick liquids did not.  SLP Visit Diagnosis: Dysphagia, oropharyngeal phase (R13.12)    Aspiration Risk  Mild aspiration risk    Diet Recommendation Dysphagia 3 (Mech soft);Nectar-thick liquid   Liquid Administration via: Cup;No straw Medication Administration: Whole meds with puree Supervision: Patient able to self feed;Full supervision/cueing for compensatory strategies Compensations: Minimize environmental distractions;Slow rate;Small sips/bites Postural Changes: Seated upright at 90  degrees    Other  Recommendations Oral Care Recommendations: Oral care BID Other Recommendations: Order thickener from pharmacy;Clarify dietary restrictions;Prohibited food (jello, ice cream, thin soups);Remove water pitcher   Follow up Recommendations Skilled Nursing facility;Inpatient Rehab      Frequency and Duration min 2x/week  1 week       Prognosis Prognosis for Safe Diet Advancement: Good      Swallow Study   General Date of Onset: 06/08/17 HPI: Patient is a 84 y.o. female with recent admission for slurred speech and left-sided numbness from right basal ganglia CVA. She was discharged on 12/29. Currently, patient is presenting with left facial droop, left leg weakness, and aphasia which reportedly started about 6:30pm on 12/31. CT scan showed "likely evolution of recent infarct in R basal ganglia, but no new infarct. Type of Study: Bedside Swallow Evaluation Previous Swallow Assessment: N/A Temperature Spikes Noted: No Respiratory Status: Room air History of Recent Intubation: No Behavior/Cognition: Alert;Cooperative;Pleasant mood;Distractible;Requires cueing Oral Cavity Assessment: Within Functional Limits Oral Care Completed by SLP: No Oral Cavity - Dentition: Adequate natural dentition Vision: Functional for self-feeding Self-Feeding Abilities: Able to feed self;Other (Comment)(needs cues to initiate ) Patient Positioning: Upright in bed Baseline Vocal Quality: Normal Volitional Cough: Cognitively unable to elicit Volitional Swallow: Able to elicit    Oral/Motor/Sensory Function Overall Oral Motor/Sensory Function: Mild impairment Facial ROM: Reduced left Facial Symmetry: Abnormal symmetry left Facial Strength: Reduced left Facial Sensation: Reduced left Lingual ROM: Within Functional Limits Lingual Symmetry: Within Functional Limits Lingual Strength: Within Functional Limits Velum: Within Functional Limits Mandible: Within Functional Limits   Ice Chips Ice  chips: Not tested   Thin Liquid Thin Liquid: Impaired Presentation: Cup Pharyngeal  Phase Impairments: Suspected delayed Swallow;Throat Clearing - Immediate;Cough -  Delayed Other Comments: Patient aware of coughing and seemed to indicate that she has had difficulty with this in the recent past.    Nectar Thick Nectar Thick Liquid: Impaired Presentation: Self Fed;Cup Pharyngeal Phase Impairments: Suspected delayed Swallow Other Comments: No overt s/s of aspiration or penetration. Patient commented on how nectar thick liquids seemed to be better and did not cause her to cough.    Honey Thick Honey Thick Liquid: Not tested   Puree Puree: Within functional limits Presentation: Spoon   Solid   GO   Solid: Impaired Oral Phase Impairments: Impaired mastication Pharyngeal Phase Impairments: Suspected delayed Swallow        Angela Nevin, MA, CCC-SLP 06/09/17 12:06 PM

## 2017-06-09 NOTE — ED Notes (Signed)
Patient is resting comfortably.  Family remains at bedside.  [facility] has been contacted and will be coming very shortly.

## 2017-06-09 NOTE — ED Notes (Signed)
Speech therapy in with the patient.
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
## 2017-06-09 NOTE — ED Notes (Signed)
Bed: [id number] Expected date:  Expected time:  Means of arrival:  Comments: Room 24

## 2017-06-10 ENCOUNTER — Inpatient Hospital Stay (HOSPITAL_COMMUNITY): Payer: Medicare Other

## 2017-06-10 ENCOUNTER — Encounter (INDEPENDENT_AMBULATORY_CARE_PROVIDER_SITE_OTHER): Payer: Medicare Other | Admitting: Ophthalmology

## 2017-06-10 DIAGNOSIS — R410 Disorientation, unspecified: Secondary | ICD-10-CM

## 2017-06-10 DIAGNOSIS — R531 Weakness: Secondary | ICD-10-CM

## 2017-06-10 DIAGNOSIS — I951 Orthostatic hypotension: Secondary | ICD-10-CM

## 2017-06-10 LAB — RENAL FUNCTION PANEL
Albumin: 2.9 g/dL — ABNORMAL LOW (ref 3.5–5.0)
Anion gap: 8 (ref 5–15)
BUN: 17 mg/dL (ref 6–20)
CO2: 24 mmol/L (ref 22–32)
Calcium: 9 mg/dL (ref 8.9–10.3)
Chloride: 104 mmol/L (ref 101–111)
Creatinine, Ser: 0.99 mg/dL (ref 0.44–1.00)
GFR calc Af Amer: 56 mL/min — ABNORMAL LOW (ref >60)
GFR calc non Af Amer: 49 mL/min — ABNORMAL LOW (ref >60)
Glucose, Bld: 90 mg/dL (ref 65–99)
Phosphorus: 3.9 mg/dL (ref 2.5–4.6)
Potassium: 3.9 mmol/L (ref 3.5–5.1)
Sodium: 136 mmol/L (ref 135–145)

## 2017-06-10 LAB — CBC WITH DIFFERENTIAL/PLATELET
Basophils Absolute: 0.1 10*3/uL (ref 0.0–0.1)
Basophils Relative: 1 %
Eosinophils Absolute: 0.2 10*3/uL (ref 0.0–0.7)
Eosinophils Relative: 2 %
HCT: 41.1 % (ref 36.0–46.0)
Hemoglobin: 13.4 g/dL (ref 12.0–15.0)
Lymphocytes Relative: 25 %
Lymphs Abs: 2.3 10*3/uL (ref 0.7–4.0)
MCH: 31.2 pg (ref 26.0–34.0)
MCHC: 32.6 g/dL (ref 30.0–36.0)
MCV: 95.8 fL (ref 78.0–100.0)
Monocytes Absolute: 0.7 10*3/uL (ref 0.1–1.0)
Monocytes Relative: 8 %
Neutro Abs: 5.7 10*3/uL (ref 1.7–7.7)
Neutrophils Relative %: 64 %
Platelets: 310 10*3/uL (ref 150–400)
RBC: 4.29 MIL/uL (ref 3.87–5.11)
RDW: 13.6 % (ref 11.5–15.5)
WBC: 9 10*3/uL (ref 4.0–10.5)

## 2017-06-10 LAB — LIPID PANEL
CHOL/HDL RATIO: 4.8 ratio
Cholesterol: 193 mg/dL (ref 0–200)
HDL: 40 mg/dL — AB (ref >40)
LDL Cholesterol: 126 mg/dL — ABNORMAL HIGH (ref 0–99)
TRIGLYCERIDES: 137 mg/dL (ref <150)
VLDL: 27 mg/dL (ref 0–40)

## 2017-06-10 MED ORDER — RESOURCE THICKENUP CLEAR PO POWD
ORAL | Status: DC | PRN
  Filled 2017-06-10: qty 125

## 2017-06-10 MED ORDER — SODIUM CHLORIDE 0.9 % IV SOLN
INTRAVENOUS | Status: DC
  Administered 2017-06-10: 10:00:00 via INTRAVENOUS

## 2017-06-10 NOTE — Evaluation (Signed)
Occupational Therapy Evaluation Patient Details Name: Lindsey Rivera MRN: [id number] DOB: March 27, 1925 Today's Date: 06/10/2017    History of Present Illness 84 y.o.femalewho wasrecently discharged from the hospital on 12/29 after stroke work up. Her initial presentation was withslurred speech and someleftsided numbness. MRI had revealed a rightbasal ganglia CVA and stenosis of M1 on the right. She re-presented on 12/31 with what per family was a new leftfacial droop, leftleg weakness and aphasia. 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 PMHx: CVA, thyroid disease.   Clinical Impression   Pt has required assist with ADL and mobility since recent hospitalization, prior to that she was independent. Currently pt max assist for ADL and max assist +2 for stand pivot transfer with max cues for sequencing and initiating participation in functional activities. Pt presenting with impaired balance, cognitive deficits, L sided visual deficits, L sided weakness impacting her independence and safety with ADL and functional mobility. Recommending SNF for follow up to maximize independence and safety with ADL and functional mobility prior to return home. Pt would benefit from continued skilled OT to address established goals.    Follow Up Recommendations  SNF;Supervision/Assistance - 24 hour    Equipment Recommendations  Other (comment)(TBD at next venue)    Recommendations for Other Services       Precautions / Restrictions Precautions Precautions: Fall Restrictions Weight Bearing Restrictions: No      Mobility Bed Mobility Overal bed mobility: Needs Assistance Bed Mobility: Supine to Sit Rolling: Max assist;+2 for physical assistance         General bed mobility comments: Assist for LEs to EOB and trunk elevation to sitting. Max  multimodal cues for sequencing and initiation.   Transfers Overall transfer level: Needs assistance Equipment used: 2 person hand held assist Transfers: Sit to/from [employer] Sit to Stand: Max assist;+2 physical assistance Stand pivot transfers: Max assist;+2 physical assistance       General transfer comment: Cues throughout for sequencing and initiation. Max assist +2 for standing balance and pivot to chair going to R side.    Balance Overall balance assessment: Needs assistance Sitting-balance support: Feet supported Sitting balance-Leahy Scale: Poor Sitting balance - Comments: Min assist throughout for sitting balance. Posterior lean noted. Postural control: Posterior lean Standing balance support: Bilateral upper extremity supported Standing balance-Leahy Scale: Poor Standing balance comment: Max assist +2 for standing balance.                           ADL either performed or assessed with clinical judgement   ADL Overall ADL's : Needs assistance/impaired Eating/Feeding: Moderate assistance;Sitting   Grooming: Maximal assistance;Sitting   Upper Body Bathing: Maximal assistance;Sitting   Lower Body Bathing: Maximal assistance;Sit to/from stand   Upper Body Dressing : Maximal assistance;Sitting   Lower Body Dressing: Maximal assistance;Sit to/from stand   Toilet Transfer: Maximal assistance;+2 for physical assistance;Stand-pivot Toilet Transfer Details (indicate cue type and reason): Simulated by transfer EOB to chair         Functional mobility during ADLs: Maximal assistance;+2 for physical assistance(2 person HHA) General ADL Comments: Discussed post acute rehab with son; he is agreeable.     Vision Baseline Vision/History: Wears glasses Wears Glasses: Reading only Additional Comments: Difficult to assess due to impaired cognition. L sided deficits noted. ?inattention vs neglect.     Perception  Praxis      Pertinent  Vitals/Pain Pain Assessment: Faces Faces Pain Scale: No hurt     Hand Dominance Right   Extremity/Trunk Assessment Upper Extremity Assessment Upper Extremity Assessment: LUE deficits/detail;Difficult to assess due to impaired cognition LUE Deficits / Details: Active movement noted but difficult to assess due to impaired cognition.   Lower Extremity Assessment Lower Extremity Assessment: Defer to PT evaluation   Cervical / Trunk Assessment Cervical / Trunk Assessment: Kyphotic   Communication Communication Communication: Expressive difficulties   Cognition Arousal/Alertness: Awake/alert Behavior During Therapy: Flat affect Overall Cognitive Status: Impaired/Different from baseline Area of Impairment: Following commands;Attention;Safety/judgement;Awareness;Problem solving                   Current Attention Level: Focused Memory: Decreased short-term memory Following Commands: Follows one step commands inconsistently;Follows one step commands with increased time Safety/Judgement: Decreased awareness of safety;Decreased awareness of deficits Awareness: Intellectual Problem Solving: Slow processing;Decreased initiation;Requires verbal cues;Difficulty sequencing;Requires tactile cues General Comments: Poor processing and sequencing during functional tasks. Following one step commands intermittently with increased time.   General Comments       Exercises     Shoulder Instructions      Home Living Family/patient expects to be discharged to:: Skilled nursing facility Living Arrangements: Alone Available Help at Discharge: Family;Available PRN/intermittently Type of Home: House Home Access: Stairs to enter [employer] of Steps: 2 Entrance Stairs-Rails: Right Home Layout: One level     Bathroom Shower/Tub: Tub/shower unit;Curtain   Firefighter: Standard     Home Equipment: Environmental consultant - 2 wheels          Prior Functioning/Environment Level of  Independence: Needs assistance  Gait / Transfers Assistance Needed: since recent d/c from hospital has not been ambulating much ADL's / Homemaking Assistance Needed: has needed assist with ADL since recent hospitalization            OT Problem List: Decreased strength;Decreased range of motion;Decreased activity tolerance;Impaired balance (sitting and/or standing);Impaired vision/perception;Decreased coordination;Decreased cognition;Decreased safety awareness;Decreased knowledge of use of DME or AE;Impaired sensation;Impaired UE functional use      OT Treatment/Interventions: Self-care/ADL training;Therapeutic exercise;Neuromuscular education;Energy conservation;DME and/or AE instruction;Therapeutic activities;Cognitive remediation/compensation;Visual/perceptual remediation/compensation;Patient/family education;Balance training    OT Goals(Current goals can be found in the care plan section) Acute Rehab OT Goals Patient Stated Goal: none stated by pt OT Goal Formulation: With patient Time For Goal Achievement: 06/24/17 Potential to Achieve Goals: Good ADL Goals Pt Will Perform Grooming: with min guard assist;sitting Pt Will Transfer to Toilet: with min assist;ambulating;bedside commode Pt Will Perform Toileting - Clothing Manipulation and hygiene: with min assist;sit to/from stand Additional ADL Goal #1: Pt will follow one step command 50% of the time with min cues.  OT Frequency: Min 2X/week   Barriers to D/C: Decreased caregiver support  pt lives alone       Co-evaluation PT/OT/SLP Co-Evaluation/Treatment: Yes Reason for Co-Treatment: Complexity of the patient's impairments (multi-system involvement);Necessary to address cognition/behavior during functional activity;For patient/therapist safety   OT goals addressed during session: ADL's and self-care      AM-PAC PT "6 Clicks" Daily Activity     Outcome Measure Help from another person eating meals?: A Lot Help from another  person taking care of personal grooming?: A Lot Help from another person toileting, which includes using toliet, bedpan, or urinal?: A Lot Help from another person bathing (including washing, rinsing, drying)?: A Lot Help from another person to put on and taking off regular upper body clothing?: A  Lot Help from another person to put on and taking off regular lower body clothing?: A Lot 6 Click Score: 12   End of Session Equipment Utilized During Treatment: Gait belt Nurse Communication: Mobility status  Activity Tolerance: Patient tolerated treatment well Patient left: in chair;with call bell/phone within reach;with chair alarm set;with family/visitor present  OT Visit Diagnosis: Unsteadiness on feet (R26.81);Other abnormalities of gait and mobility (R26.89);Cognitive communication deficit (R41.841);Hemiplegia and hemiparesis Symptoms and signs involving cognitive functions: Cerebral infarction Hemiplegia - Right/Left: Left Hemiplegia - dominant/non-dominant: Non-Dominant Hemiplegia - caused by: Cerebral infarction                Time: [id number] OT Time Calculation (min): 17 min Charges:  OT General Charges $OT Visit: 1 Visit OT Evaluation $OT Eval Moderate Complexity: 1 Mod G-Codes:     Jobina Maita A. Brett Albinooffey, M.S., OTR/L Pager: [phone]  Gaye AlkenBailey A Ilyaas Musto 06/10/2017, 12:26 PM

## 2017-06-10 NOTE — Progress Notes (Signed)
PROGRESS NOTE    Lindsey Rivera  RUE:[id number] DOB: 01-20-1925 DOA: 06/08/2017 PCP: Clovis Riley, L.August Saucer, MD  Outpatient Specialists:     Brief Narrative: Patient is a 84 y.o. 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  Was seen by neurology and dc'd on 12/29 on asa/ plavix for 3 mos then plavix alone.  Now pt presenting with L facial droop, L leg weak and aphasia started about 6:30 pm today.  LKN was 4 pm.  CT scan shows likely evolution of recent infarct in R basal ganglia, but no new infarct.  Seen by teleneurology, not TPA candidate.  CT angio showed no new large vessel occlusion. Asked to see for admission to [facility].    06/09/17 - Seen alongside Neuro Nurse practitioner. Patient is reported to be confused today. Patient moves all limbs. Awaiting PT input. Likely rehab.  Assessment & Plan:   Active Problems:   CKD (chronic kidney disease) stage 3, GFR 30-59 ml/min (HCC)   Cerebrovascular accident (CVA) of right basal ganglia (HCC) - evolution of recent CVA   Aphasia   Left hemiparesis (HCC)- mild   Essential hypertension   Anxiety   Hypothyroidism   Assessment/plan: 1. Altered mental status/Encephalopathy - Possibly due to acute CVA. Will need to rule out other etiology. Will check UA, CBC with diff, renal panel, CXR and will start IVF. Continue to assess. Neurology input is highly appreciated. 2. Acute CVA, with recent worsening - Work up is in progress. Neuro is directing care. On Aspirin and Plavix.   3. HTN - Optimized. All medication on hold. Will continue to monitor. 4. Depression - cont prozac, hold tranxene 5. Hypothyroidism 6. HL - On statin. Optimize. 7. CKD 3 - baseline creat 0.9- 1.2 8. DNR - per family present   DVT prophylaxis: [place] Heparin Code Status: DNR Family Communication:  Disposition Plan: Likely SNF   Consultants:   neurology  Procedures:   None  Antimicrobials:    None    Subjective: No much history from patient. Patient is reported to be more confused.  Objective: Vitals:   06/09/17 1623 06/09/17 2043 06/10/17 0112 06/10/17 0517  BP: (!) 150/72 (!) 127/94 (!) 141/67 (!) 145/69  Pulse: 72 68 66 66  Resp:  18 18 18   Temp: 98.3 F (36.8 C) 98.3 F (36.8 C) 98.2 F (36.8 C) 98.1 F (36.7 C)  TempSrc: Oral Oral Axillary Oral  SpO2: 96% 94% 95% 95%    Intake/Output Summary (Last 24 hours) at 06/10/2017 1478 Last data filed at 06/10/2017 0900 Gross per 24 hour  Intake 0 ml  Output -  Net 0 ml   There were no vitals filed for this visit.  Examination:  General exam: Appears calm and comfortable  HEENT - Mild pallor. No jaundice Respiratory system: Clear to auscultation. Respiratory effort normal. Cardiovascular system: S1 & S2. Gastrointestinal system: Abdomen is obese, soft and nontender. Organs are not palpable. Central nervous system: awake and Alert. Patient moves all limbs. Extremities: No leg edema. Bruises around the knees.   Data Reviewed: I have personally reviewed following labs and imaging studies  CBC: Recent Labs  Lab 06/04/17 0945 06/04/17 1002 06/05/17 0417 06/08/17 2121 06/08/17 2136  WBC 9.8  --  7.9 9.4  --   NEUTROABS 6.7  --   --  5.1  --   HGB 12.9 13.6 12.6 13.6 12.6  HCT  39.8 40.0 38.5 40.3 37.0  MCV 97.1  --  96.5 95.5  --   PLT 283  --  278 274  --    Basic Metabolic Panel: Recent Labs  Lab 06/04/17 0945 06/04/17 1002 06/04/17 1334 06/05/17 0417 06/08/17 2121 06/08/17 2136  NA 139 145  --  138 139 140  K 3.9 4.0  --  3.7 4.0 4.0  CL 104 104  --  104 105 104  CO2 25  --   --  26 27  --   GLUCOSE 109* 104*  --  95 116* 112*  BUN 13 13  --  14 30* 30*  CREATININE 0.96 0.90  --  1.22* 1.23* 1.20*  CALCIUM 8.9  --   --  8.9 9.1  --   MG  --   --  2.1  --   --   --   PHOS  --   --  3.3  --   --   --    GFR: Estimated Creatinine Clearance: 28.1 mL/min (A) (by C-G formula based on SCr of  1.2 mg/dL (H)). Liver Function Tests: Recent Labs  Lab 06/04/17 0945 06/05/17 0417 06/08/17 2121  AST 23 25 28   ALT 17 17 22   ALKPHOS 56 51 54  BILITOT 0.5 0.7 1.0  PROT 6.5 6.0* 6.9  ALBUMIN 2.9* 2.8* 3.1*   No results for input(s): LIPASE, AMYLASE in the last 168 hours. No results for input(s): AMMONIA in the last 168 hours. Coagulation Profile: Recent Labs  Lab 06/04/17 0945 06/08/17 2121  INR 0.96 0.97   Cardiac Enzymes: No results for input(s): CKTOTAL, CKMB, CKMBINDEX, TROPONINI in the last 168 hours. BNP (last 3 results) No results for input(s): PROBNP in the last 8760 hours. HbA1C: No results for input(s): HGBA1C in the last 72 hours. CBG: Recent Labs  Lab 06/08/17 2157  GLUCAP 100*   Lipid Profile: No results for input(s): CHOL, HDL, LDLCALC, TRIG, CHOLHDL, LDLDIRECT in the last 72 hours. Thyroid Function Tests: No results for input(s): TSH, T4TOTAL, FREET4, T3FREE, THYROIDAB in the last 72 hours. Anemia Panel: No results for input(s): VITAMINB12, FOLATE, FERRITIN, TIBC, IRON, RETICCTPCT in the last 72 hours. Urine analysis:    Component Value Date/Time   COLORURINE STRAW (A) 06/04/2017 0944   APPEARANCEUR CLEAR 06/04/2017 0944   LABSPEC 1.010 06/04/2017 0944   PHURINE 5.0 06/04/2017 0944   GLUCOSEU NEGATIVE 06/04/2017 0944   HGBUR NEGATIVE 06/04/2017 0944   BILIRUBINUR NEGATIVE 06/04/2017 0944   KETONESUR NEGATIVE 06/04/2017 0944   PROTEINUR NEGATIVE 06/04/2017 0944   NITRITE NEGATIVE 06/04/2017 0944   LEUKOCYTESUR NEGATIVE 06/04/2017 0944   Sepsis Labs: @LABRCNTIP (procalcitonin:4,lacticidven:4)  )No results found for this or any previous visit (from the past 240 hour(s)).       Radiology Studies: Ct Angio Head W Or Wo Contrast  Result Date: 06/08/2017 CLINICAL DATA:  84 y/o  F; code stroke for follow-up. EXAM: CT ANGIOGRAPHY HEAD AND NECK TECHNIQUE: Multidetector CT imaging of the head and neck was performed using the standard protocol  during bolus administration of intravenous contrast. Multiplanar CT image reconstructions and MIPs were obtained to evaluate the vascular anatomy. Carotid stenosis measurements (when applicable) are obtained utilizing NASCET criteria, using the distal internal carotid diameter as the denominator. CONTRAST:  60mL ISOVUE-370 IOPAMIDOL (ISOVUE-370) INJECTION 76% COMPARISON:  06/08/2017 CT head. 06/04/2017 CT angiogram head and neck. FINDINGS: CTA NECK FINDINGS Aortic arch: Standard branching. Imaged portion shows no evidence of aneurysm or dissection. No significant  stenosis of the major arch vessel origins. Mild calcific atherosclerosis. Right carotid system: No evidence of dissection, stenosis (50% or greater) or occlusion. Mild non stenotic calcific atherosclerosis of carotid siphon. Left carotid system: No evidence of dissection, stenosis (50% or greater) or occlusion. Mild non stenotic calcific atherosclerosis of carotid siphon. Severe stenosis of left external carotid origin. Vertebral arteries: Codominant. No evidence of dissection, stenosis (50% or greater) or occlusion. Motion related left vertebral artery origin. Skeleton: Moderate cervical spondylosis with prominent facet arthropathy. No high-grade canal stenosis. Other neck: Negative. Upper chest: Negative. Review of the MIP images confirms the above findings CTA HEAD FINDINGS Anterior circulation: Stable right proximal M1 occlusion within mid right M1 reconstitution. No new large vessel occlusion identified. Calcific atherosclerosis of carotid siphons without significant stenosis. Stable left ICA aneurysm of distal cavernous segment projecting superiorly and laterally measuring 4 mm to dome and 4 mm in diameter (series 9, image 92). Posterior circulation: Severe stenosis of left fetal PCA and left P2 segment. Patent bilateral vertebral and basilar arteries. Patent right posterior cerebral artery. Venous sinuses: As permitted by contrast timing, patent.  Anatomic variants: Patent right posterior communicating artery. Fetal left PCA. Delayed phase: No abnormal intracranial enhancement. Stable distribution of infarct in right anterior basal ganglia. Review of the MIP images confirms the above findings IMPRESSION: 1. Stable proximal right M1 occlusion with mid right M1 reconstitution. 2. Stable tandem left PCA segments of severe stenosis. Fetal left PCA. 3. 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 4. No new large vessel occlusion, high-grade stenosis, or aneurysm of the anterior or posterior intracranial circulation. 5. Patent carotid and vertebral arteries. No dissection, aneurysm, or significant stenosis by NASCET criteria is identified. Electronically Signed   By: Mitzi Hansen M.D.   On: 06/08/2017 23:41   Ct Angio Neck W And/or Wo Contrast  Result Date: 06/08/2017 CLINICAL DATA:  84 y/o  F; code stroke for follow-up. EXAM: CT ANGIOGRAPHY HEAD AND NECK TECHNIQUE: Multidetector CT imaging of the head and neck was performed using the standard protocol during bolus administration of intravenous contrast. Multiplanar CT image reconstructions and MIPs were obtained to evaluate the vascular anatomy. Carotid stenosis measurements (when applicable) are obtained utilizing NASCET criteria, using the distal internal carotid diameter as the denominator. CONTRAST:  60mL ISOVUE-370 IOPAMIDOL (ISOVUE-370) INJECTION 76% COMPARISON:  06/08/2017 CT head. 06/04/2017 CT angiogram head and neck. FINDINGS: CTA NECK FINDINGS Aortic arch: Standard branching. Imaged portion shows no evidence of aneurysm or dissection. No significant stenosis of the major arch vessel origins. Mild calcific atherosclerosis. Right carotid system: No evidence of dissection, stenosis (50% or greater) or occlusion. Mild non stenotic calcific atherosclerosis of carotid siphon. Left carotid system: No evidence of dissection, stenosis (50% or greater) or occlusion. Mild non stenotic calcific  atherosclerosis of carotid siphon. Severe stenosis of left external carotid origin. Vertebral arteries: Codominant. No evidence of dissection, stenosis (50% or greater) or occlusion. Motion related left vertebral artery origin. Skeleton: Moderate cervical spondylosis with prominent facet arthropathy. No high-grade canal stenosis. Other neck: Negative. Upper chest: Negative. Review of the MIP images confirms the above findings CTA HEAD FINDINGS Anterior circulation: Stable right proximal M1 occlusion within mid right M1 reconstitution. No new large vessel occlusion identified. Calcific atherosclerosis of carotid siphons without significant stenosis. Stable left ICA aneurysm of distal cavernous segment projecting superiorly and laterally measuring 4 mm to dome and 4 mm in diameter (series 9, image 92). Posterior circulation: Severe stenosis of left fetal PCA and left P2  segment. Patent bilateral vertebral and basilar arteries. Patent right posterior cerebral artery. Venous sinuses: As permitted by contrast timing, patent. Anatomic variants: Patent right posterior communicating artery. Fetal left PCA. Delayed phase: No abnormal intracranial enhancement. Stable distribution of infarct in right anterior basal ganglia. Review of the MIP images confirms the above findings IMPRESSION: 1. Stable proximal right M1 occlusion with mid right M1 reconstitution. 2. Stable tandem left PCA segments of severe stenosis. Fetal left PCA. 3. 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 4. No new large vessel occlusion, high-grade stenosis, or aneurysm of the anterior or posterior intracranial circulation. 5. Patent carotid and vertebral arteries. No dissection, aneurysm, or significant stenosis by NASCET criteria is identified. Electronically Signed   By: Mitzi Hansen M.D.   On: 06/08/2017 23:41   Mr Shirlee Latch YQ Contrast  Result Date: 06/10/2017 CLINICAL DATA:  Initial evaluation for slurred speech with left-sided  numbness, recent right basal ganglia infarct, now with new left facial droop, left leg weakness, and aphasia. EXAM: MRI HEAD WITHOUT CONTRAST MRA HEAD WITHOUT CONTRAST TECHNIQUE: Multiplanar, multiecho pulse sequences of the brain and surrounding structures were obtained without intravenous contrast. Angiographic images of the head were obtained using MRA technique without contrast. COMPARISON:  Prior CTA from 06/08/2017 as well as previous MRI from 06/04/2017. FINDINGS: MRI HEAD FINDINGS Brain: Previously identified right basal ganglia infarct is enlarged as compared to previous exam, likely reflecting acute on evolving subacute ischemia. Area of infarction now measures 2.5 x 3.6 cm in size. Patchy involvement of the adjacent subinsular white matter. Additionally, there are new scattered acute ischemic infarcts involving the deep white matter of the right corona radiata and centrum semi ovale as well as the right periatrial white matter. Few overlying subcentimeter cortical infarcts. These are somewhat in a watershed type distribution. Mild localized mass effect about the right basal ganglia infarct with partial attenuation of the adjacent right lateral ventricle. No associated hemorrhage. No other evidence for acute ischemia. Few scattered chronic micro hemorrhages again noted involving the bilateral cerebellum in right parietal region. Stable cerebral volume. Mild for age chronic microvascular disease. No mass lesion or midline shift. No hydrocephalus. No extra-axial fluid collection. Major dural sinuses are patent. Pituitary gland suprasellar region normal. Midline structures intact and normal. Vascular: Patchy signal abnormality within knee cavernous right ICA with mid right M1 high-grade stenosis in/lower partial occlusion grossly similar. Major intracranial vascular flow voids otherwise maintained. Skull and upper cervical spine: Craniocervical junction normal. Upper cervical spine within normal limits. Bone  marrow signal intensity normal. No scalp soft tissue abnormality. Sinuses/Orbits: Globes oral soft tissues normal. Patient status post cataract extraction bilaterally. Paranasal sinuses remain largely clear. Small left mastoid effusion noted. Inner ear structures normal. Other: None. MRA HEAD FINDINGS ANTERIOR CIRCULATION: Study degraded by motion artifact. Distal cervical segments of the internal carotid artery is patent with antegrade flow. Petrous segments patent bilaterally. Cavernous and supraclinoid left ICA widely patent. Previously identified cavernous left ICA aneurysm measures approximately 4 mm in craniocaudad dimension, stable from previous when measured at similar level. Left ICA terminus widely patent. Cavernous and supraclinoid right ICA demonstrates scattered atheromatous irregularity in is mildly narrowed as compared to the left. A1 segments patent bilaterally. Anterior communicating artery not well assessed. Anterior cerebral arteries patent to their distal aspects without stenosis. Previously identified proximal right M1 occlusion and/ or high-grade/near occlusive stenosis with distal reconstitution again seen, relatively stable from previous (series 4, image 52). Patent but attenuated flow within the right MCA  territory distally. Left M1 segment patent without stenosis. Severe proximal left M2 stenosis, middle division noted, stable (Series 4, image 59). Distal left MCA branches demonstrate atheromatous irregularity but are patent to their distal aspects. POSTERIOR CIRCULATION: Vertebral arteries patent to the vertebrobasilar junction without stenosis. Patent right PICA. Left PICA not visualized. Basilar widely patent to its distal aspect. Superior cerebral arteries patent bilaterally. Right PCA supplied via the basilar and is well perfused to its distal aspect. Fetal type origin of the left PCA tandem severe left P2 stenoses again noted, grossly stable. IMPRESSION: MRI HEAD IMPRESSION: 1.  Interval enlargement of right basal ganglia infarct, with additional multiple new acute right MCA territory infarcts as above, most of which are in a watershed type distribution. No associated hemorrhage. 2. Otherwise stable appearance of the brain. MRA HEAD IMPRESSION: 1. Stable appearance of the intracranial circulation with occlusion and/or severe near occlusive stenosis of the proximal right M1 segment with distal reconstitution and attenuated flow distally. 2. Severe near occlusive proximal left M2 stenosis, middle division, also stable. 3. Severe tandem left PCA stenoses, stable. 4. 4 mm cavernous left ICA aneurysm, unchanged. Electronically Signed   By: Rise Mu M.D.   On: 06/10/2017 01:33   Mr Brain Wo Contrast  Result Date: 06/10/2017 CLINICAL DATA:  Initial evaluation for slurred speech with left-sided numbness, recent right basal ganglia infarct, now with new left facial droop, left leg weakness, and aphasia. EXAM: MRI HEAD WITHOUT CONTRAST MRA HEAD WITHOUT CONTRAST TECHNIQUE: Multiplanar, multiecho pulse sequences of the brain and surrounding structures were obtained without intravenous contrast. Angiographic images of the head were obtained using MRA technique without contrast. COMPARISON:  Prior CTA from 06/08/2017 as well as previous MRI from 06/04/2017. FINDINGS: MRI HEAD FINDINGS Brain: Previously identified right basal ganglia infarct is enlarged as compared to previous exam, likely reflecting acute on evolving subacute ischemia. Area of infarction now measures 2.5 x 3.6 cm in size. Patchy involvement of the adjacent subinsular white matter. Additionally, there are new scattered acute ischemic infarcts involving the deep white matter of the right corona radiata and centrum semi ovale as well as the right periatrial white matter. Few overlying subcentimeter cortical infarcts. These are somewhat in a watershed type distribution. Mild localized mass effect about the right basal ganglia  infarct with partial attenuation of the adjacent right lateral ventricle. No associated hemorrhage. No other evidence for acute ischemia. Few scattered chronic micro hemorrhages again noted involving the bilateral cerebellum in right parietal region. Stable cerebral volume. Mild for age chronic microvascular disease. No mass lesion or midline shift. No hydrocephalus. No extra-axial fluid collection. Major dural sinuses are patent. Pituitary gland suprasellar region normal. Midline structures intact and normal. Vascular: Patchy signal abnormality within knee cavernous right ICA with mid right M1 high-grade stenosis in/lower partial occlusion grossly similar. Major intracranial vascular flow voids otherwise maintained. Skull and upper cervical spine: Craniocervical junction normal. Upper cervical spine within normal limits. Bone marrow signal intensity normal. No scalp soft tissue abnormality. Sinuses/Orbits: Globes oral soft tissues normal. Patient status post cataract extraction bilaterally. Paranasal sinuses remain largely clear. Small left mastoid effusion noted. Inner ear structures normal. Other: None. MRA HEAD FINDINGS ANTERIOR CIRCULATION: Study degraded by motion artifact. Distal cervical segments of the internal carotid artery is patent with antegrade flow. Petrous segments patent bilaterally. Cavernous and supraclinoid left ICA widely patent. Previously identified cavernous left ICA aneurysm measures approximately 4 mm in craniocaudad dimension, stable from previous when measured at similar level. Left ICA terminus  widely patent. Cavernous and supraclinoid right ICA demonstrates scattered atheromatous irregularity in is mildly narrowed as compared to the left. A1 segments patent bilaterally. Anterior communicating artery not well assessed. Anterior cerebral arteries patent to their distal aspects without stenosis. Previously identified proximal right M1 occlusion and/ or high-grade/near occlusive stenosis  with distal reconstitution again seen, relatively stable from previous (series 4, image 52). Patent but attenuated flow within the right MCA territory distally. Left M1 segment patent without stenosis. Severe proximal left M2 stenosis, middle division noted, stable (Series 4, image 59). Distal left MCA branches demonstrate atheromatous irregularity but are patent to their distal aspects. POSTERIOR CIRCULATION: Vertebral arteries patent to the vertebrobasilar junction without stenosis. Patent right PICA. Left PICA not visualized. Basilar widely patent to its distal aspect. Superior cerebral arteries patent bilaterally. Right PCA supplied via the basilar and is well perfused to its distal aspect. Fetal type origin of the left PCA tandem severe left P2 stenoses again noted, grossly stable. IMPRESSION: MRI HEAD IMPRESSION: 1. Interval enlargement of right basal ganglia infarct, with additional multiple new acute right MCA territory infarcts as above, most of which are in a watershed type distribution. No associated hemorrhage. 2. Otherwise stable appearance of the brain. MRA HEAD IMPRESSION: 1. Stable appearance of the intracranial circulation with occlusion and/or severe near occlusive stenosis of the proximal right M1 segment with distal reconstitution and attenuated flow distally. 2. Severe near occlusive proximal left M2 stenosis, middle division, also stable. 3. Severe tandem left PCA stenoses, stable. 4. 4 mm cavernous left ICA aneurysm, unchanged. Electronically Signed   By: Rise MuBenjamin  McClintock M.D.   On: 06/10/2017 01:33   Ct Head Code Stroke Wo Contrast  Result Date: 06/08/2017 CLINICAL DATA:  Code stroke. Left facial droop and left arm weakness. Code stroke. Recent infarct. Progressive symptoms. EXAM: CT HEAD WITHOUT CONTRAST TECHNIQUE: Contiguous axial images were obtained from the base of the skull through the vertex without intravenous contrast. COMPARISON:  CT head without contrast 06/04/2017. CTA  head and neck and MRI head 06/04/2017. FINDINGS: Brain: The previous scratched at the recent nonhemorrhagic infarct involving many anterior basal ganglia on the right demonstrates expected evolution. The insular cortex is intact. The posterior basal ganglia are within normal limits. No other acute or focal cortical abnormalities are present. White matter changes are stable. There is no hemorrhage into the infarct. The brainstem and cerebellum are stable. Atrophy is unchanged. There is progressive effacement of the ventral horn of the right lateral ventricle compatible with edema associated with evolving infarct. The ventricles are otherwise stable. No significant extra-axial fluid collection is present. Vascular: Atherosclerotic calcifications are again seen within the cavernous internal carotid artery is bilaterally. There is no hyperdense vessel. Skull: Calvarium is intact. No focal lytic or blastic lesions are present. Sinuses/Orbits: The paranasal sinuses and mastoid air cells are clear. Bilateral lens replacements are present. Globes and orbits are within normal limits. Scratched ASPECTS [facility]([place] Stroke Program Early CT Score) - Ganglionic level infarction (caudate, lentiform nuclei, internal capsule, insula, M1-M3 cortex): 7/7 - Supraganglionic infarction (M4-M6 cortex): 3/3 Total score (0-10 with 10 being normal): 10/10 IMPRESSION: 1. Expected evolution of recent infarct involving the anterior right basal ganglia. 2. No new infarct. 3. Otherwise stable atrophy and white matter disease. 4. ASPECTS is 10/10 These results were called by telephone at the time of interpretation on 06/08/2017 at 9:49 pm to Dr. Bethann BerkshireJOSEPH ZAMMIT , who verbally acknowledged these results. Electronically Signed   By: Virl Sonhristopher  Mattern M.D.  On: 06/08/2017 21:51        Scheduled Meds: .  stroke: mapping our early stages of recovery book   Does not apply Once  . aspirin  325 mg Oral Daily  . atorvastatin  40 mg Oral q1800  .  clopidogrel  75 mg Oral Daily  . FLUoxetine  10 mg Oral Daily  . heparin  5,000 Units Subcutaneous Q12H  . levothyroxine  50 mcg Oral QAC breakfast  . montelukast  10 mg Oral QHS  . timolol  1 drop Left Eye Daily   Continuous Infusions: . sodium chloride 50 mL/hr at 06/09/17 1800     LOS: 1 day    Time spent: 92 Minutes    Berton Mount, MD  [facility] Pager #: [phone] 7PM-7AM contact night coverage as above
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
## 2017-06-10 NOTE — Progress Notes (Signed)
  Speech Language Pathology Treatment: Dysphagia  Patient Details Name: Lindsey FetchDorothy C Rivera MRN: [id number] DOB: 03/04/1925 Today's Date: 06/10/2017 Time: 0820-0900 SLP Time Calculation (min) (ACUTE ONLY): 40 min  Assessment / Plan / Recommendation Clinical Impression  Pt continues to demonstrate immediate signs of aspiration with thin liquids with concerns for a delayed swallow response based on subjective findings. Recommend pt continue [facility]: Dys 3 (mech soft/nectar thick liquids) and SLP will f/u with MBS prior to d/c for objective assessment of swallowing to provide best diet, strategies and treatment plan.   HPI HPI: Patient is a 84 y.o. female with recent admission for slurred speech and left-sided numbness from right basal ganglia CVA. She was discharged on 12/29. MRI shows Interval enlargement of right basal ganglia infarct, with additional multiple new acute right MCA territory infarcts deep white matter of the right corona radiata and centrum semi ovale as well as the right periatrial white matter. Few overlying subcentimeter cortical infarcts, most of which are in a watershed type distribution.  In prior evaluaions pt demonstrated cognitive linguistic deficits including attention, memory, organization, language (word finding, naming, word fluency), and intellectual awareness. Communication described as language of confusion, tangential and perseverative. Son at bedside reported that pts function was not far from baseline.       SLP Plan  MBS  Patient needs continued Speech Lanaguage Pathology Services    Recommendations  Diet recommendations: Dysphagia 3 (mechanical soft);Nectar-thick liquid Liquids provided via: Cup Medication Administration: Whole meds with puree Supervision: Staff to assist with self feeding Compensations: Minimize environmental distractions;Slow rate;Small sips/bites Postural Changes and/or Swallow Maneuvers: Seated upright 90 degrees                 Oral Care Recommendations: Oral care BID Follow up Recommendations: Skilled Nursing facility SLP Visit Diagnosis: Dysphagia, oropharyngeal phase (R13.12) Plan: MBS       GO               Harlon DittyBonnie Sae Handrich, MA CCC-SLP [phone]  Claudine MoutonDeBlois, Curry Dulski Caroline 06/10/2017, 9:28 AM

## 2017-06-10 NOTE — Progress Notes (Signed)
Modified Barium Swallow Progress Note  Patient Details  Name: Lindsey Rivera MRN: [id number] Date of Birth: 06/26/1924  Today's Date: 06/10/2017  Modified Barium Swallow completed.  Full report located under Chart Review in the Imaging Section.  Brief recommendations include the following:  Clinical Impression  Pt demonstrates a mild oral dysphagia with slow bolus formation and transit, marked with solids, requiring a liquid wash to fully transit bolus. Pts function is impacted during study by lethargy and cognitive deficits. Pt is unable to follow most tactile or verbal cues. Swallow initaition is intermittently delayed resulting in silent aspiration of thin liquids before the swallow and infrequent sensed penetration of nectar thick liquids with immediate ejection. Recommend pt consume only pureed solids until arousal improves, may continue nectar thick liquids. May benefit from retest when consistently alert.   Swallow Evaluation Recommendations       SLP Diet Recommendations: Dysphagia 1 (Puree) solids;Nectar thick liquid   Liquid Administration via: Cup;Straw   Medication Administration: Whole meds with puree   Supervision: Staff to assist with self feeding   Compensations: Slow rate;Small sips/bites;Minimize environmental distractions;Clear throat intermittently   Postural Changes: Remain semi-upright after after feeds/meals (Comment)   Oral Care Recommendations: Oral care BID   Other Recommendations: Order thickener from pharmacy;Clarify dietary restrictions;Prohibited food (jello, ice cream, thin soups);Remove water pitcher   Harlon DittyBonnie [place], [place] CCC-SLP [phone]  Claudine MoutonDeBlois, Christpoher Sievers Caroline 06/10/2017,2:11 PM

## 2017-06-10 NOTE — Evaluation (Signed)
Physical Therapy Evaluation Patient Details Name: Lindsey Rivera MRN: [id number] DOB: 01/19/1925 Today's Date: 06/10/2017   History of Present Illness  84 y.o.femalewho wasrecently discharged from the hospital on 12/29 after stroke work up. Her initial presentation was withslurred speech and someleftsided numbness. MRI had revealed a rightbasal ganglia CVA and stenosis of M1 on the right. She re-presented on 12/31 with what per family was a new leftfacial droop, leftleg weakness and aphasia. 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 PMHx: CVA, thyroid disease.  Clinical Impression  Pt admitted secondary to problem above with deficits below. Pt has required increased assist since previous admission and has not been ambulating much. Was previously independent. Upon eval, pt presenting with cognitive deficits and increased difficulty with sequencing and motor planning. Also presenting with L sided weakness and required max A +2 for mobility this session. Recommending SNF at d/c to increase independence and safety with functional mobility. Spoke with pt's son, and pt's son agreeable to d/c recommendations. Will continue to follow acutely to maximize functional mobility independence and safety.     Follow Up Recommendations Supervision/Assistance - 24 hour;SNF    Equipment Recommendations  None recommended by PT    Recommendations for Other Services       Precautions / Restrictions Precautions Precautions: Fall Restrictions Weight Bearing Restrictions: No      Mobility  Bed Mobility Overal bed mobility: Needs Assistance Bed Mobility: Supine to Sit Rolling: Max assist;+2 for physical assistance         General bed mobility comments: Assist for LEs to EOB and trunk elevation to sitting. Max multimodal cues for sequencing  and initiation.   Transfers Overall transfer level: Needs assistance Equipment used: 2 person hand held assist Transfers: Sit to/from [employer] Sit to Stand: Max assist;+2 physical assistance Stand pivot transfers: Max assist;+2 physical assistance       General transfer comment: Cues throughout for sequencing and initiation. Max assist +2 for standing balance and pivot to chair going to R side.  Ambulation/Gait             General Gait Details: NT   Stairs            Wheelchair Mobility    Modified Rankin (Stroke Patients Only) Modified Rankin (Stroke Patients Only) Pre-Morbid Rankin Score: Moderately severe disability Modified Rankin: Moderately severe disability     Balance Overall balance assessment: Needs assistance Sitting-balance support: Feet supported Sitting balance-Leahy Scale: Poor Sitting balance - Comments: Min assist throughout for sitting balance. Posterior lean noted. Postural control: Posterior lean Standing balance support: Bilateral upper extremity supported Standing balance-Leahy Scale: Poor Standing balance comment: Max assist +2 for standing balance.                             Pertinent Vitals/Pain Pain Assessment: Faces Faces Pain Scale: No hurt    Home Living Family/patient expects to be discharged to:: Skilled nursing facility Living Arrangements: Alone Available Help at Discharge: Family;Available PRN/intermittently Type of Home: House Home Access: Stairs to enter Entrance Stairs-Rails: Right Entrance Stairs-Number of Steps: 2 Home Layout: One level Home Equipment: Walker - 2 wheels      Prior Function Level of Independence: Needs assistance   Gait / Transfers Assistance Needed: since recent d/c from hospital has not been ambulating much  ADL's /  Homemaking Assistance Needed: has needed assist with ADL since recent hospitalization        Hand Dominance   Dominant Hand: Right     Extremity/Trunk Assessment   Upper Extremity Assessment Upper Extremity Assessment: Defer to OT evaluation LUE Deficits / Details: Active movement noted but difficult to assess due to impaired cognition.    Lower Extremity Assessment Lower Extremity Assessment: LLE deficits/detail LLE Deficits / Details: Active movement, however, noted difficulty sequencing. Difficulty to assess secondary to cognitive deficits.     Cervical / Trunk Assessment Cervical / Trunk Assessment: Kyphotic  Communication   Communication: Expressive difficulties  Cognition Arousal/Alertness: Awake/alert Behavior During Therapy: Flat affect Overall Cognitive Status: Impaired/Different from baseline Area of Impairment: Following commands;Attention;Safety/judgement;Awareness;Problem solving                   Current Attention Level: Focused Memory: Decreased short-term memory Following Commands: Follows one step commands inconsistently;Follows one step commands with increased time Safety/Judgement: Decreased awareness of safety;Decreased awareness of deficits Awareness: Intellectual Problem Solving: Slow processing;Decreased initiation;Requires verbal cues;Difficulty sequencing;Requires tactile cues General Comments: Poor processing and sequencing during functional tasks. Following one step commands intermittently with increased time.      General Comments General comments (skin integrity, edema, etc.): Noted L sided attention deficits during session. Pt's son present during session. Educated about need for further rehab given pt's decline, and pt's son agreeable.     Exercises     Assessment/Plan    PT Assessment Patient needs continued PT services  PT Problem List Decreased strength;Decreased balance;Decreased cognition;Cardiopulmonary status limiting activity;Decreased mobility;Decreased knowledge of use of DME;Decreased activity tolerance;Decreased safety awareness;Decreased  coordination;Decreased knowledge of precautions       PT Treatment Interventions Functional mobility training;Balance training;Patient/family education;Gait training;Therapeutic activities;Stair training;Neuromuscular re-education;Therapeutic exercise;Cognitive remediation;DME instruction    PT Goals (Current goals can be found in the Care Plan section)  Acute Rehab PT Goals Patient Stated Goal: unable  PT Goal Formulation: Patient unable to participate in goal setting Time For Goal Achievement: 06/24/17 Potential to Achieve Goals: Fair    Frequency Min 3X/week   Barriers to discharge Decreased caregiver support      Co-evaluation PT/OT/SLP Co-Evaluation/Treatment: Yes Reason for Co-Treatment: Complexity of the patient's impairments (multi-system involvement);Necessary to address cognition/behavior during functional activity;For patient/therapist safety;To address functional/ADL transfers PT goals addressed during session: Mobility/safety with mobility;Balance OT goals addressed during session: ADL's and self-care       AM-PAC PT "6 Clicks" Daily Activity  Outcome Measure Difficulty turning over in bed (including adjusting bedclothes, sheets and blankets)?: Unable Difficulty moving from lying on back to sitting on the side of the bed? : Unable Difficulty sitting down on and standing up from a chair with arms (e.g., wheelchair, bedside commode, etc,.)?: Unable Help needed moving to and from a bed to chair (including a wheelchair)?: A Lot Help needed walking in hospital room?: A Lot Help needed climbing 3-5 steps with a railing? : Total 6 Click Score: 8    End of Session Equipment Utilized During Treatment: Gait belt Activity Tolerance: Patient tolerated treatment well Patient left: in chair;with call bell/phone within reach;with family/visitor present;with chair alarm set Nurse Communication: Mobility status PT Visit Diagnosis: Unsteadiness on feet (R26.81);Difficulty in  walking, not elsewhere classified (R26.2);Muscle weakness (generalized) (M62.81);Other symptoms and signs involving the nervous system (R29.898)    Time: 1610-9604 PT Time Calculation (min) (ACUTE ONLY): 17 min   Charges:   PT Evaluation $PT Eval Moderate Complexity: 1 Mod  PT G Codes:        Gladys DammeBrittany Emori Mumme, PT, DPT  Acute Rehabilitation Services  Pager: [phone]   Lehman PromBrittany S Glenola Wheat 06/10/2017, 12:59 PM

## 2017-06-10 NOTE — Progress Notes (Signed)
NEUROHOSPITALISTS STROKE TEAM - DAILY PROGRESS NOTE   ADMISSION HISTORY: Lindsey Rivera is an 84 y.o. female who was recently discharged from the hospital on 12/29 after stroke work up. Her initial presentation was with slurred speech and some left sided numbness. MRI had revealed a right basal ganglia CVA and stenosis of M1 on the right. She had been discharged with plan to be on ASA/Plavix for 3 months, followed by switch to Plavix monotherapy. She re-presented on 12/31 with what per family was a new left facial droop, left leg weakness and aphasia starting at about 6:30 pm that day.  Per Teleneurology consult, her re-presentation with left sided weakness and slurred speech in the setting of recent right BG and posterior frontal infarctswas suspected to be due to worsening R MCA syndrome from R MCA disease. Teleneurology consultant determined that she was not a tPA candidate due to LKN > 4.5 hours. Additionally, she had recently had a stroke which presented risk for hemorrhagic conversion. Teleneurology also determined that her presentation was not suggestive of LVO and that therefore thrombectomy would not be recommended.   SUBJECTIVE (INTERVAL HISTORY) No family is at the bedside. Patient is found laying in bed in NAD.  Patient is confused and not at her mentation baseline.  Voices no new complaints. No new events reported overnight.   OBJECTIVE Lab Results: CBC:  Recent Labs  Lab 06/04/17 0945  06/05/17 0417 06/08/17 2121 06/08/17 2136  WBC 9.8  --  7.9 9.4  --   HGB 12.9   < > 12.6 13.6 12.6  HCT 39.8   < > 38.5 40.3 37.0  MCV 97.1  --  96.5 95.5  --   PLT 283  --  278 274  --    < > = values in this interval not displayed.   BMP: Recent Labs  Lab 06/04/17 0945 06/04/17 1002 06/04/17 1334 06/05/17 0417 06/08/17 2121 06/08/17 2136  NA 139 145  --  138 139 140  K 3.9 4.0  --  3.7 4.0 4.0  CL 104 104  --  104 105 104    CO2 25  --   --  26 27  --   GLUCOSE 109* 104*  --  95 116* 112*  BUN 13 13  --  14 30* 30*  CREATININE 0.96 0.90  --  1.22* 1.23* 1.20*  CALCIUM 8.9  --   --  8.9 9.1  --   MG  --   --  2.1  --   --   --   PHOS  --   --  3.3  --   --   --    Liver Function Tests:  Recent Labs  Lab 06/04/17 0945 06/05/17 0417 06/08/17 2121  AST 23 25 28   ALT 17 17 22   ALKPHOS 56 51 54  BILITOT 0.5 0.7 1.0  PROT 6.5 6.0* 6.9  ALBUMIN 2.9* 2.8* 3.1*   PHYSICAL EXAM Temp:  [98 F (36.7 C)-98.5 F (36.9 C)] 98.5 F (36.9 C) (01/02 0957) Pulse Rate:  [61-72] 65 (01/02 0957) Resp:  [13-20] 18 (01/02 0957) BP: (117-156)/(54-94) 134/64 (01/02 0957) SpO2:  [93 %-99 %] 95 % (01/02 0957) General - Well nourished, well developed, in no apparent distress HEENT-  Normocephalic, Normal external eye/conjunctiva.  Normal external ears. Normal external nose, mucus membranes and septum.   Cardiovascular - Regular rate and rhythm  Respiratory - Lungs clear bilaterally. No wheezing. Abdomen - soft and non-tender, BS normal  Extremities- no edema or cyanosis Neurologic Examination: Mental Status: asleep but arouses to voice, mostly cooperative with exam but is confused and not at her mentation baseline. She is non-agitated. Oriented to self only.  Speech output is nonfluent and nonsensical, No dysarthria noted. Able to follow simple motor commands, but often ignores commands, requiring them to be repeated.  Cranial Nerves: II, III,IV, VI: Does not cooperate fully with visual exam. Does not appear to be able to cross midline, ?Right gaze preference. No blink to threat on Left V,VII: Left facial droop noted. Does not cooperate with sensory exam VIII: hearing intact to voice, HOH at baseline IX,X: No hoarseness noted XI: Lag on the left XII: Mild dysarthria Motor: Lifts each leg in the air to command, right is normal, left with a lag and decreased elevation. Lifts each arm in the air to command, right is  normal, left with a lag and decreased elevation. Grips examiner's hand, right stronger than left.  Sensory: Uncooperative Deep Tendon Reflexes: Hypoactive upper extremities. Absent patellae and achilles bilaterally.  Plantars: Equivocal bilaterally Cerebellar: Non-cooperative.  Gait: Not tested  IMAGING: I have personally reviewed the radiological images below and agree with the radiology interpretations.  Ct Angio Head and  Neck W And/or Wo Contrast Result Date: 06/08/2017 IMPRESSION: 1. Stable proximal right M1 occlusion with mid right M1 reconstitution. 2. Stable tandem left PCA segments of severe stenosis. Fetal left PCA. 3. 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 4. No new large vessel occlusion, high-grade stenosis, or aneurysm of the anterior or posterior intracranial circulation. 5. Patent carotid and vertebral arteries. No dissection, aneurysm, or significant stenosis by NASCET criteria is identified. Electronically Signed   By: Mitzi Hansen M.D.   On: 06/08/2017 23:41   MRI/MRA Brain Wo Contrast Result Date: 06/10/2017 IMPRESSION: MRI HEAD IMPRESSION: 1. Interval enlargement of right basal ganglia infarct, with additional multiple new acute right MCA territory infarcts as above, most of which are in a watershed type distribution. No associated hemorrhage. 2. Otherwise stable appearance of the brain. MRA HEAD IMPRESSION: 1. Stable appearance of the intracranial circulation with occlusion and/or severe near occlusive stenosis of the proximal right M1 segment with distal reconstitution and attenuated flow distally. 2. Severe near occlusive proximal left M2 stenosis, middle division, also stable. 3. Severe tandem left PCA stenoses, stable. 4. 4 mm cavernous left ICA aneurysm, unchanged. Electronically Signed   By: Rise Mu M.D.   On: 06/10/2017 01:33   Ct Head Code Stroke Wo Contrast Result Date: 06/08/2017 IMPRESSION: 1. Expected evolution of recent infarct  involving the anterior right basal ganglia. 2. No new infarct. 3. Otherwise stable atrophy and white matter disease. 4. ASPECTS is 10/10 These results were called by telephone at the time of interpretation on 06/08/2017 at 9:49 pm to Dr. Bethann Berkshire , who verbally acknowledged these results. Electronically Signed   By: Marin Roberts M.D.   On: 06/08/2017 21:51    Echocardiogram:    06/05/2017   Study Conclusions - Left ventricle: The cavity size was normal. Systolic function was   vigorous. The estimated ejection fraction was in the range of 65%   to 70%. Wall motion was normal; there were no regional wall   motion abnormalities. Doppler parameters are consistent with   abnormal left ventricular relaxation (grade 1 diastolic   dysfunction). There was no evidence of elevated ventricular   filling pressure by Doppler parameters. - Aortic valve: Trileaflet; mildly thickened, mildly calcified   leaflets. There  was mild regurgitation. - Aortic root: The aortic root was normal in size. - Mitral valve: There was mild regurgitation. - Left atrium: The atrium was normal in size. - Right ventricle: The cavity size was normal. Wall thickness was   normal. Systolic function was normal. - Right atrium: The atrium was normal in size. - Tricuspid valve: There was no regurgitation. - Pulmonary arteries: Systolic pressure could not be accurately   estimated. - Inferior vena cava: The vessel was normal in size. - Pericardium, extracardiac: There was no pericardial effusion. Impressions:- No cardiac source of emboli was indentified.    IMPRESSION: [name] is a 84 y.o. female with PMH of recent right basal ganglia stroke secondary to proximal right M1 occlusion with mid-right M1 reconstitution. Re-presents with left leg weakness, left facial droop and slurred speech.  MRI reveals:  Enlargement of right basal ganglia infarct Multiple new acute right MCA territory infarcts -  watershed type distribution  Suspected Etiology: Likely Hypotension vs undiagnosed atrial fibrillation Resultant Symptoms: Left facial droop, left visual field cut, left-sided weakness Stroke Risk Factors: hyperlipidemia and hypertension Other Stroke Risk Factors: Advanced age, Hx stroke  Outstanding Stroke Work-up Studies:    No further testing needed at this time  06/10/2017 ASSESSMENT:   Neuro exam remained stable with left facial droop, left visual field cut and left-sided weakness.  Patient currently not at her mentation baseline.  Failed SLP swallow eval.  MBS pending.  Discussion with daughter by phone.  She believes patient was taking Inderal daily.  Will be important to avoid avoid hypotension at this time.  We will continue to follow  PLAN  06/10/2017: Continue Aspirin/Plavix /statin for now May need to consider anticoagulation therapy if atrial fibrillation seen on telemetry Avoid hypotension Avoid any medications that would contribute to patient's confusion Frequent neuro checks Telemetry monitoring PT/OT/SLP Consult PM & Rehab Consult Case Management /MSW Patient is scheduled for cardiac event monitoring on June 16, 2017 Ongoing aggressive stroke risk factor management Patients family will be counseled to be compliant with her antithrombotic medications Patients family will be counseled on Lifestyle modifications including, Diet, Exercise, and Stress Follow up with [facility] in 6 weeks  HX OF STROKES: Right basal ganglia infarct,  Known INTRACRANIAL Atherosclerosis &Stenosis: Currently on DAPT, continue for 3 months and then Plavix alone  R/O AFIB Patient is scheduled for cardiac event monitoring on June 16, 2017  4 mm cavernous left ICA aneurysm,  Stable findings Surveillance per outpatient neurology recommendations  DYSPHAGIA: Failed SLP swallow evaluation -MBS pending Aspiration Precautions in progress  HYPERTENSION: Stable, patient  likely suffered from hypotension at home. Check orthostatics Avoid hypotension Permissive hypertension (OK if <220/120) for 24-48 hours post stroke and then gradually normalized within 5-7 days. Long term BP goal normotensive. May slowly restart home B/P medications after 48 hours Home Meds: Inderal - discontinued  HYPERLIPIDEMIA:    Component Value Date/Time   CHOL 193 06/10/2017 0805   TRIG 137 06/10/2017 0805   HDL 40 (L) 06/10/2017 0805   CHOLHDL 4.8 06/10/2017 0805   VLDL 27 06/10/2017 0805   LDLCALC 126 (H) 06/10/2017 0805  Home Meds:  Lipitor 40 mg LDL  goal < 70 Continued on Lipitor to 40 mg daily Continue statin at discharge  R/O DIABETES: Lab Results  Component Value Date   HGBA1C 5.7 (H) 06/05/2017  HgbA1c goal < 7.0  Other Active Problems: Active Problems:   CKD (chronic kidney disease) stage 3, GFR 30-59  ml/min [facility](HCC)   Cerebrovascular accident (CVA) of right basal ganglia (HCC) - evolution of recent CVA   Aphasia   Left hemiparesis (HCC)- mild   Essential hypertension   Anxiety   Hypothyroidism    Hospital day # 1 VTE prophylaxis: Heparin  Diet : DIET DYS 3 Room service appropriate? Yes; Fluid consistency: Nectar Thick   FAMILY UPDATES: No family at bedside  TEAM UPDATES: Barnetta Chapelgbata, Sylvester I, MD STATUS:  DNR     Prior Home Stroke Medications:  aspirin 325 mg daily and clopidogrel 75 mg daily  Discharge Stroke Meds:  Please discharge patient on aspirin 325 mg daily, clopidogrel 75 mg daily and Lipitor 40 mg   Disposition: 06-Home-Health Care Svc Therapy Recs:               PENDING Home Equipment:         PENDING Follow Up:  Follow-up Information    Nilda RiggsMartin, Nancy Carolyn, NP. Schedule an appointment as soon as possible for a visit in 6 week(s).   Specialty:  Family Medicine Contact information: [address] [place] [address] [phone]          Clovis RileyMitchell, L.August Saucerean, MD -PCP Follow up in 1-2 weeks      Assessment & plan  discussed with with attending physician and they are in agreement.    Brita RompMary A Costello, ANP-C Stroke Neurology Team 06/10/2017 11:28 AM  ATTENDING NOTE: I reviewed above note and agree with the assessment and plan. I have made any additions or clarifications directly to the above note. Pt was seen and examined.   84 year old female was just discharged last Saturday for right basal ganglia large infarct.  CTA head and neck at the time showed right M1 high-grade stenosis and left PCA stenosis.  EF 65-70% A1c 5.7 and LDL 165.  She was discharged with dual antiplatelet and Lipitor.  Recommended 30-day cardio event monitor as outpatient to rule out A. Fib.  Patient again admitted yesterday for left facial droop slurred speech and left leg weakness.  On examination showed patient had left lower quadrant visual field cut left facial droop, left upper and lower extremity drift, left neglect.  CTA head neck showed essentially the same as last time.  MRI showed right MCA infarct extension from basal ganglia to patchy left MCA territory.  MRI showed right M1, left M2, left PCA stenosis.  Continue on dual antiplatelet and Lipitor.   Her stroke likely caused by low BP and failed collateral flow.  On admission her BP as low as 106/60.  No BP medication on board.  Had orthostatic vital today showed lying 144/78, standing 130/91, indicating mild positional BP changes.  Recommend TED hose, adequate hydration and slowly getting up to avoid hypotension.  PT/OT recommended SNF.   Marvel PlanJindong Jerrard Bradburn, MD PhD Stroke Neurology 06/10/2017 2:43 PM   To contact Stroke Continuity provider, please refer to [email or web address]. After hours, contact General Neurology

## 2017-06-11 MED ORDER — RESOURCE THICKENUP CLEAR PO POWD: 1.0000 | ORAL | 0 refills | Status: AC | PRN

## 2017-06-11 NOTE — Clinical Social Work Placement (Signed)
Nurse to call report to [phone]    CLINICAL SOCIAL WORK PLACEMENT  NOTE  Date:  06/11/2017  Patient Details  Name: Lindsey Rivera MRN: [id number] Date of Birth: 07/22/1924  Clinical Social Work is seeking post-discharge placement for this patient at the Skilled  Nursing Facility level of care (*CSW will initial, date and re-position this form in  chart as items are completed):  Yes   Patient/family provided with [facility] Clinical Social Work Department's list of facilities offering this level of care within the geographic area requested by the patient (or if unable, by the patient's family).  Yes   Patient/family informed of their freedom to choose among providers that offer the needed level of care, that participate in Medicare, Medicaid or managed care program needed by the patient, have an available bed and are willing to accept the patient.  Yes   Patient/family informed of [facility]'s ownership interest in [facility] and [facility], as well as of the fact that they are under no obligation to receive care at these facilities.  PASRR submitted to EDS on 06/10/17     PASRR number received on 06/11/17     Existing PASRR number confirmed on       FL2 transmitted to all facilities in geographic area requested by pt/family on 06/10/17     FL2 transmitted to all facilities within larger geographic area on       Patient informed that his/her managed care company has contracts with or will negotiate with certain facilities, including the following:        Yes   Patient/family informed of bed offers received.  [facility]     Physician recommends and patient chooses bed at      [facility] to [facility] on 06/11/17.  Patient to be transferred to facility by [facility]     Patient family notified on 06/11/17 of transfer.  Name of family member notified:  Peyton NajjarLarry     PHYSICIAN       Additional  Comment:    _______________________________________________ Baldemar LenisElizabeth M Lourdez Mcgahan, LCSW 06/11/2017, 1:43 PM

## 2017-06-11 NOTE — Discharge Summary (Signed)
Physician Discharge Summary  Patient ID: Lindsey Rivera MRN: [id number] DOB/AGE: 82/12/1924 82 y.o.  Admit date: 06/08/2017 Discharge date: 06/11/2017  Admission Diagnoses:  Discharge Diagnoses:  Active Problems:   CKD (chronic kidney disease) stage 3, GFR 30-59 ml/min (HCC)   Cerebrovascular accident (CVA) of right basal ganglia (HCC) - evolution of recent CVA   Aphasia   Left hemiparesis (HCC)- mild   Essential hypertension   Anxiety   Hypothyroidism   Orthostatic hypotension   Discharged Condition: stable  Hospital Course: Patient is a [age] year old Caucasian femalewho was just in hospital with slurred speech and some left sided numbness, had R basal ganglia CVA and stenosis of M1 on the R. Patient was seen by neurology and discharged on 06/06/2017 with plans to continue on Aspirin and Plavix for 3 months, and then change to alone. Patient was re admitted to the Hospital on 06/08/2017 with left facial droop, left leg weak and aphasia.CT scan Head revealed likely evolution of recent infarct in Right basal ganglia, but no new infarct. Patient was seen by teleneurology, and deemed not TPA candidate. CT angio Head and Neck showed no new large vessel occlusion. Worsening of stroke symptoms is thought to be related to relatively low Blood pressure. Patient was admitted, and management was directed by the Neurology team. Repeat imaging are as above, including MRI head. Physical, occupational and Speech Therapies were also consulted. Patient will be discharged to a Skilled Nursing facility. Kindly see recommendations by the Speech Therapy team below.   Consults: Neurology, Physical, Occupational and Speech therapy  Significant Diagnostic Studies: radiology: MRI/FMRA head and CT Angio Head and Neck  Treatments: Speech therapy recommends continued [facility].       Recommendations as per Speech therapy:   Diet recommendations: Dysphagia 3 (mechanical  soft);Nectar-thick liquid Liquids provided via: Cup Medication Administration: Whole meds with puree Supervision: Staff to assist with self feeding Compensations: Minimize environmental distractions;Slow rate;Small sips/bites Postural Changes and/or Swallow Maneuvers: Seated upright 90 degrees       Discharge Exam: Blood pressure (!) 157/64, pulse 66, temperature 97.7 F (36.5 C), temperature source Oral, resp. rate 20, SpO2 92 %.   Disposition: 06-Home-Health Care Svc  Discharge Instructions    Ambulatory referral to Neurology   Complete by:  As directed    An appointment is requested in approximately: 6 weeks Follow up with stroke clinic Lindsey Rivera(Lindsey Rivera preferred, if not available, then consider Lindsey Rivera, Lindsey [facility], [facility] or Lindsey Rivera whoever is available) [facility] in about 6-8 weeks. Thanks.   Call MD for:   Complete by:  As directed    Call MD with worsening of symptoms   Diet - low sodium heart healthy   Complete by:  As directed    Aspiration precaution   Increase activity slowly   Complete by:  As directed      Allergies as of 06/11/2017      Reactions   Darvon [propoxyphene] Other (See Comments)   unknown   Sulfa Antibiotics Palpitations      Medication List    STOP taking these medications   clorazepate 7.5 MG tablet Commonly known as:  TRANXENE   FISH OIL OMEGA-3 PO   OSTEO BI-FLEX REGULAR STRENGTH PO     TAKE these medications   aspirin 325 MG tablet Take 1 tablet (325 mg total) by mouth daily.   atorvastatin 40 MG tablet Commonly known as:  LIPITOR Take 1 tablet (40 mg total) by mouth daily at 6 PM.  calcium-vitamin D 500-200 MG-UNIT tablet Commonly known as:  OSCAL WITH D Take 1 tablet by mouth daily with breakfast.   clopidogrel 75 MG tablet Commonly known as:  PLAVIX Take 1 tablet (75 mg total) by mouth daily.   FLUoxetine 10 MG capsule Commonly known as:  PROZAC Take 10 mg by mouth daily.   levothyroxine 50 MCG tablet Commonly known as:   SYNTHROID, LEVOTHROID Take 50 mcg by mouth daily before breakfast.   montelukast 10 MG tablet Commonly known as:  SINGULAIR Take 10 mg by mouth at bedtime.   prednisoLONE acetate 1 % ophthalmic suspension Commonly known as:  PRED FORTE Place 1 drop into the left eye every 2 (two) hours while awake.   PRESERVISION AREDS PO Take 1 tablet by mouth 2 (two) times daily.   RESOURCE THICKENUP CLEAR Powd Take 120 g by mouth as needed.   thiamine 100 MG tablet Take 100 mg by mouth daily.   timolol 0.5 % ophthalmic solution Commonly known as:  BETIMOL Place 1 drop into the left eye daily.      Follow-up Information    Lindsey Riggs, NP. Schedule an appointment as soon as possible for a visit in 6 week(s).   Specialty:  Family Medicine Contact information: [address] [place] [address] [phone]           Signed: Barnetta Rivera 06/11/2017, 12:32 PM

## 2017-06-11 NOTE — Progress Notes (Signed)
Physical Therapy Treatment Patient Details Name: Lindsey Rivera MRN: [id number] DOB: 10/21/1924 Today's Date: 06/11/2017    History of Present Illness 84 y.o.femalewho wasrecently discharged from the hospital on 12/29 after stroke work up. Her initial presentation was withslurred speech and someleftsided numbness. MRI had revealed a rightbasal ganglia CVA and stenosis of M1 on the right. She re-presented on 12/31 with what per family was a new leftfacial droop, leftleg weakness and aphasia. 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 PMHx: CVA, thyroid disease.    PT Comments    Pt with slow progression towards goals. Showed improvement with simple command following with bed mobility, however, continues to exhibit slowed processing and poor sequencing during mobility. Max A for mobility and for stand pivot to chair. Required max multimodal cues for sequencing throughout transfer. Current recommendations appropriate. Will continue to follow acutely.    Follow Up Recommendations  Supervision/Assistance - 24 hour;SNF     Equipment Recommendations  None recommended by PT    Recommendations for Other Services       Precautions / Restrictions Precautions Precautions: Fall Restrictions Weight Bearing Restrictions: No    Mobility  Bed Mobility Overal bed mobility: Needs Assistance Bed Mobility: Supine to Sit;Rolling Rolling: Max assist   Supine to sit: Max assist     General bed mobility comments: Required max multimodal cues for sequencing. Max A to move LEs to EOB and for trunk elevation. Pt required max A to roll for clean up following BM. Able to follow simple commands to assist with rolling.   Transfers Overall transfer level: Needs assistance Equipment used: 1 person hand held assist Transfers: Sit to/from  UG[employer] CorporationStand;Stand Pivot Transfers Sit to Stand: Max assist Stand pivot transfers: Max assist       General transfer comment: PT stood in front of pt for balance and to help shift weight for pt to step over to chair. Cues throughout for LE sequencing during transfer.   Ambulation/Gait             General Gait Details: Unsafe to attempt    Stairs            Wheelchair Mobility    Modified Rankin (Stroke Patients Only) Modified Rankin (Stroke Patients Only) Pre-Morbid Rankin Score: Moderately severe disability Modified Rankin: Moderately severe disability     Balance Overall balance assessment: Needs assistance Sitting-balance support: Feet supported Sitting balance-Leahy Scale: Poor Sitting balance - Comments: One LOB posteriorly requiring max A to recover.  Postural control: Posterior lean Standing balance support: Bilateral upper extremity supported Standing balance-Leahy Scale: Poor Standing balance comment: Max A for standing balance.                             Cognition Arousal/Alertness: Awake/alert Behavior During Therapy: Flat affect Overall Cognitive Status: Impaired/Different from baseline Area of Impairment: Following commands;Attention;Safety/judgement;Awareness;Problem solving                   Current Attention Level: Focused Memory: Decreased short-term memory Following Commands: Follows one step commands inconsistently;Follows one step commands with increased time Safety/Judgement: Decreased awareness of safety;Decreased awareness of deficits Awareness: Intellectual Problem Solving: Slow processing;Decreased initiation;Requires verbal cues;Difficulty sequencing;Requires tactile cues General Comments: Improved command following noted during bed mobility, however, still presents with poor processing and sequencing during functional tasks.  Exercises      General Comments General comments (skin integrity, edema, etc.):  Continues to present with L sided inattention      Pertinent Vitals/Pain Pain Assessment: Faces Faces Pain Scale: No hurt    Home Living                      Prior Function            PT Goals (current goals can now be found in the care plan section) Acute Rehab PT Goals Patient Stated Goal: unable  PT Goal Formulation: Patient unable to participate in goal setting Time For Goal Achievement: 06/24/17 Potential to Achieve Goals: Fair Progress towards PT goals: Progressing toward goals    Frequency    Min 3X/week      PT Plan Current plan remains appropriate    Co-evaluation              AM-PAC PT "6 Clicks" Daily Activity  Outcome Measure  Difficulty turning over in bed (including adjusting bedclothes, sheets and blankets)?: Unable Difficulty moving from lying on back to sitting on the side of the bed? : Unable Difficulty sitting down on and standing up from a chair with arms (e.g., wheelchair, bedside commode, etc,.)?: Unable Help needed moving to and from a bed to chair (including a wheelchair)?: A Lot Help needed walking in hospital room?: A Lot Help needed climbing 3-5 steps with a railing? : Total 6 Click Score: 8    End of Session Equipment Utilized During Treatment: Gait belt Activity Tolerance: Patient tolerated treatment well Patient left: in chair;with call bell/phone within reach;with family/visitor present;with chair alarm set Nurse Communication: Mobility status PT Visit Diagnosis: Unsteadiness on feet (R26.81);Difficulty in walking, not elsewhere classified (R26.2);Muscle weakness (generalized) (M62.81);Other symptoms and signs involving the nervous system (R29.898)     Time: 1610-9604 PT Time Calculation (min) (ACUTE ONLY): 32 min  Charges:  $Therapeutic Activity: 23-37 mins                    G Codes:       Gladys Damme, PT, DPT  Acute Rehabilitation Services  Pager: [phone]    Lehman Prom 06/11/2017,  12:21 PM

## 2017-06-11 NOTE — Progress Notes (Signed)
Pt  D/C to skilled Nursing facility, pt had 2 loose stools, MD notified. Hand over report given to Danielle at nursing facility. [facility]

## 2017-06-11 NOTE — Progress Notes (Signed)
NEUROHOSPITALISTS STROKE TEAM - DAILY PROGRESS NOTE   SUBJECTIVE (INTERVAL HISTORY) Son at bedside family is at the bedside. Patient is found laying in bed in NAD.  Patient is closer to her mentation baseline on exam today, but continues to have episodes of word salad.  Voices no new complaints. No new events reported overnight.   OBJECTIVE Lab Results: CBC:  Recent Labs  Lab 06/05/17 0417 06/08/17 2121 06/08/17 2136 06/10/17 1055  WBC 7.9 9.4  --  9.0  HGB 12.6 13.6 12.6 13.4  HCT 38.5 40.3 37.0 41.1  MCV 96.5 95.5  --  95.8  PLT 278 274  --  310   BMP: Recent Labs  Lab 06/04/17 1334 06/05/17 0417 06/08/17 2121 06/08/17 2136 06/10/17 1055  NA  --  138 139 140 136  K  --  3.7 4.0 4.0 3.9  CL  --  104 105 104 104  CO2  --  26 27  --  24  GLUCOSE  --  95 116* 112* 90  BUN  --  14 30* 30* 17  CREATININE  --  1.22* 1.23* 1.20* 0.99  CALCIUM  --  8.9 9.1  --  9.0  MG 2.1  --   --   --   --   PHOS 3.3  --   --   --  3.9   Liver Function Tests:  Recent Labs  Lab 06/05/17 0417 06/08/17 2121 06/10/17 1055  AST 25 28  --   ALT 17 22  --   ALKPHOS 51 54  --   BILITOT 0.7 1.0  --   PROT 6.0* 6.9  --   ALBUMIN 2.8* 3.1* 2.9*   PHYSICAL EXAM Temp:  [97.7 F (36.5 C)-99.4 F (37.4 C)] 97.8 F (36.6 C) (01/03 1256) Pulse Rate:  [60-77] 77 (01/03 1256) Resp:  [16-20] 16 (01/03 1256) BP: (114-157)/(53-78) 137/57 (01/03 1256) SpO2:  [92 %-97 %] 93 % (01/03 1256) General - Well nourished, well developed, in no apparent distress HEENT-  Normocephalic, Normal external eye/conjunctiva.  Normal external ears. Normal external nose, mucus membranes and septum.   Cardiovascular - Regular rate and rhythm  Respiratory - Lungs clear bilaterally. No wheezing. Abdomen - soft and non-tender, BS normal Extremities- no edema or cyanosis Neurologic Examination: Mental Status:  Patient awake, mostly cooperative with exam, less  confused today, not at her mentation baseline. No agitation noted. Oriented to self, place and son.  Speech output at times is nonfluent and nonsensical, No dysarthria noted. Able to follow simple motor commands, but often ignores commands, requiring them to be repeated.  Cranial Nerves: II, III,IV, VI: Does not cooperate fully with visual exam. Does not appear to be able to cross midline, ?Right gaze preference. No blink to threat on Left V,VII: Left facial droop noted. Does not cooperate with sensory exam VIII: hearing intact to voice, HOH at baseline IX,X: No hoarseness noted XI: Lag on the left XII: Mild dysarthria Motor: Lifts each leg in the air to command, right is normal, left with a lag and decreased elevation. Lifts each arm in the air to command, right is normal, left with a lag and decreased elevation. Grips examiner's hand, right stronger than left.  Sensory: Uncooperative Deep Tendon Reflexes: Hypoactive upper extremities. Absent patellae and achilles bilaterally.  Plantars: Equivocal bilaterally Cerebellar: Non-cooperative.  Gait: Not tested  IMAGING: I have personally reviewed the radiological images below and agree with the radiology interpretations.  Ct Angio Head and  Neck  W And/or Wo Contrast Result Date: 06/08/2017 IMPRESSION: 1. Stable proximal right M1 occlusion with mid right M1 reconstitution. 2. Stable tandem left PCA segments of severe stenosis. Fetal left PCA. 3. 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 4. No new large vessel occlusion, high-grade stenosis, or aneurysm of the anterior or posterior intracranial circulation. 5. Patent carotid and vertebral arteries. No dissection, aneurysm, or significant stenosis by NASCET criteria is identified. Electronically Signed   By: Mitzi Hansen M.D.   On: 06/08/2017 23:41   MRI/MRA Brain Wo Contrast Result Date: 06/10/2017 IMPRESSION: MRI HEAD IMPRESSION: 1. Interval enlargement of right basal ganglia  infarct, with additional multiple new acute right MCA territory infarcts as above, most of which are in a watershed type distribution. No associated hemorrhage. 2. Otherwise stable appearance of the brain. MRA HEAD IMPRESSION: 1. Stable appearance of the intracranial circulation with occlusion and/or severe near occlusive stenosis of the proximal right M1 segment with distal reconstitution and attenuated flow distally. 2. Severe near occlusive proximal left M2 stenosis, middle division, also stable. 3. Severe tandem left PCA stenoses, stable. 4. 4 mm cavernous left ICA aneurysm, unchanged. Electronically Signed   By: Rise Mu M.D.   On: 06/10/2017 01:33   Ct Head Code Stroke Wo Contrast Result Date: 06/08/2017 IMPRESSION: 1. Expected evolution of recent infarct involving the anterior right basal ganglia. 2. No new infarct. 3. Otherwise stable atrophy and white matter disease. 4. ASPECTS is 10/10 These results were called by telephone at the time of interpretation on 06/08/2017 at 9:49 pm to Dr. Bethann Berkshire , who verbally acknowledged these results. Electronically Signed   By: Marin Roberts M.D.   On: 06/08/2017 21:51    Echocardiogram:    06/05/2017   Study Conclusions - Left ventricle: The cavity size was normal. Systolic function was   vigorous. The estimated ejection fraction was in the range of 65%   to 70%. Wall motion was normal; there were no regional wall   motion abnormalities. Doppler parameters are consistent with   abnormal left ventricular relaxation (grade 1 diastolic   dysfunction). There was no evidence of elevated ventricular   filling pressure by Doppler parameters. - Aortic valve: Trileaflet; mildly thickened, mildly calcified   leaflets. There was mild regurgitation. - Aortic root: The aortic root was normal in size. - Mitral valve: There was mild regurgitation. - Left atrium: The atrium was normal in size. - Right ventricle: The cavity size was normal.  Wall thickness was   normal. Systolic function was normal. - Right atrium: The atrium was normal in size. - Tricuspid valve: There was no regurgitation. - Pulmonary arteries: Systolic pressure could not be accurately   estimated. - Inferior vena cava: The vessel was normal in size. - Pericardium, extracardiac: There was no pericardial effusion. Impressions:- No cardiac source of emboli was indentified.    IMPRESSION: 84 year old female was just discharged last Saturday for right basal ganglia large infarct.  CTA head and neck at the time showed right M1 high-grade stenosis and left PCA stenosis.  EF 65-70% A1c 5.7 and LDL 165.  She was discharged with dual antiplatelet and Lipitor.  Recommended 30-day cardio event monitor as outpatient to rule out A. Fib.  Patient again admitted yesterday for left facial droop slurred speech and left leg weakness.  On examination showed patient had left lower quadrant visual field cut left facial droop, left upper and lower extremity drift, left neglect.  CTA head neck showed essentially the same as  last time.  MRI showed right MCA infarct extension from basal ganglia to patchy left MCA territory.  MRI showed right M1, left M2, left PCA stenosis.  Continue on dual antiplatelet and Lipitor. Her stroke likely caused by low BP and failed collateral flow.  On admission her BP as low as 106/60.  No BP medication on board.  Had orthostatic vital today showed lying 144/78, standing 130/91, indicating mild positional BP changes.  Recommend TED hose, adequate hydration and slowly getting up to avoid hypotension.  PT/OT recommended SNF.   Enlargement of right basal ganglia infarct Multiple new acute right MCA territory infarcts - watershed type distribution  Suspected Etiology: Likely Hypotension vs undiagnosed atrial fibrillation Resultant Symptoms: Left facial droop, left visual field cut, left-sided weakness Stroke Risk Factors: hyperlipidemia and hypertension Other  Stroke Risk Factors: Advanced age, Hx stroke  Outstanding Stroke Work-up Studies:    No further testing needed at this time  06/11/2017 ASSESSMENT:   Neuro exam remained stable with left facial droop, left visual field cut and left-sided weakness - .  Patient is close to her mentation baseline on exam today, but continues to have episodes of word salad.  Passed MBS with dysphagia 1 diet.  Son at bedside, imaging and plan of care reviewed.  Plan to discharge to skilled nursing facility today  PLAN  06/11/2017: Continue Aspirin/Plavix /statin for now Avoid hypotension and dehydration, continue TED hose Avoid any medications that would contribute to patient's confusion Patient to be discharged today to skilled nursing facility Patient is scheduled for cardiac event monitoring on June 16, 2017 Ongoing aggressive stroke risk factor management Patients family will be counseled to be compliant with her antithrombotic medications Patients family will be counseled on Lifestyle modifications including, Diet, Exercise, and Stress Follow up with [facility] in 6 weeks  HX OF STROKES: Right basal ganglia infarct,  Known INTRACRANIAL Atherosclerosis &Stenosis: Currently on DAPT, continue for 3 months and then Plavix alone  R/O AFIB Patient is scheduled for cardiac event monitoring on June 16, 2017  4 mm cavernous left ICA aneurysm,  Stable findings Surveillance per outpatient neurology recommendations  DYSPHAGIA: Passed-MBS with dysphasia 1 diet Aspiration Precautions in progress  HYPERTENSION: Stable, patient likely suffered from hypotension at home. Continue compression stockings as needed Avoid hypotension and dehydration Permissive hypertension (OK if <220/120) for 24-48 hours post stroke and then gradually normalized within 5-7 days. Long term BP goal normotensive. May slowly restart home B/P medications after 48 hours Home Meds: Inderal -  discontinued  HYPERLIPIDEMIA:    Component Value Date/Time   CHOL 193 06/10/2017 0805   TRIG 137 06/10/2017 0805   HDL 40 (L) 06/10/2017 0805   CHOLHDL 4.8 06/10/2017 0805   VLDL 27 06/10/2017 0805   LDLCALC 126 (H) 06/10/2017 0805  Home Meds:  Lipitor 40 mg LDL  goal < 70 Continued on Lipitor to 40 mg daily Continue statin at discharge  R/O DIABETES: Lab Results  Component Value Date   HGBA1C 5.7 (H) 06/05/2017  HgbA1c goal < 7.0  Other Active Problems: Active Problems:   CKD (chronic kidney disease) stage 3, GFR 30-59 ml/min (HCC)   Cerebrovascular accident (CVA) of right basal ganglia (HCC) - evolution of recent CVA   Aphasia   Left hemiparesis (HCC)- mild   Essential hypertension   Anxiety   Hypothyroidism   Orthostatic hypotension    Hospital day # 2 VTE prophylaxis: Heparin  Diet : DIET - DYS 1 Room service appropriate? Yes;  Fluid consistency: Nectar Thick Diet - low sodium heart healthy   FAMILY UPDATES: No family at bedside  TEAM UPDATES: Barnetta Chapel, MD STATUS:  DNR     Prior Home Stroke Medications:  aspirin 325 mg daily and clopidogrel 75 mg daily  Discharge Stroke Meds:  Please discharge patient on aspirin 325 mg daily, clopidogrel 75 mg daily and Lipitor 40 mg   Disposition: 06-Home-Health Care Svc Therapy Recs:               SNF Home Equipment:         TBD Follow Up:  Follow-up Information    Nilda Riggs, NP. Schedule an appointment as soon as possible for a visit in 6 week(s).   Specialty:  Family Medicine Contact information: [address] [place] [address] [phone]          Clovis Riley, L.August Saucer, MD -PCP Follow up in 1-2 weeks      Assessment & plan discussed with with attending physician and they are in agreement.    Beryl Meager, ANP-C Stroke Neurology Team 06/11/2017 1:18 PM  ATTENDING NOTE: I reviewed above note and agree with the assessment and plan. I have made any additions or  clarifications directly to the above note. Pt was seen and examined.   84 year old female was just discharged last Saturday for right basal ganglia large infarct.  CTA head and neck at the time showed right M1 high-grade stenosis and left PCA stenosis.  EF 65-70% A1c 5.7 and LDL 165.  She was discharged with dual antiplatelet and Lipitor.   Patient again admitted yesterday for left facial droop slurred speech and left leg weakness.  On examination showed patient had left lower quadrant visual field cut left facial droop, left upper and lower extremity drift, left neglect.  CTA head neck showed essentially the same as last time.  MRI showed right MCA infarct extension from basal ganglia to patchy left MCA territory.  MRI showed right M1, left M2, left PCA stenosis.  Continue on dual antiplatelet and Lipitor.   Her stroke likely caused by low BP and failed collateral flow.  On admission her BP as low as 106/60.  No BP medication on board.  Had orthostatic vital today showed lying 144/78, standing 130/91, indicating mild positional BP changes. On TED hose, recommend adequate hydration and slowly getting up to avoid hypotension.  Recommended 30-day cardio event monitor as outpatient to rule out A. Fib. Pending SNF placement.   Neurology will sign off. Please call with questions. Pt will follow up with Dr. Pearlean Brownie at [facility] in about 6 weeks. Thanks for the consult.  Marvel Plan, MD PhD Stroke Neurology 06/11/2017 9:22 PM  To contact Stroke Continuity provider, please refer to [email or web address]. After hours, contact General Neurology

## 2017-06-11 NOTE — Care Management Note (Signed)
Case Management Note  Patient Details  Name: Lindsey FetchDorothy C Rivera MRN: [id number] Date of Birth: 10/21/1924  Subjective/Objective:                    Action/Plan: [facility] No further needs per CM.  Expected Discharge Date:  06/11/17               Expected Discharge Plan:  Skilled Nursing Facility  In-House Referral:  Clinical Social Work  Discharge planning Services     Post Acute Care Choice:    Choice offered to:     DME Arranged:    DME Agency:     HH Arranged:    HH Agency:     Status of Service:  Completed, signed off  If discussed at [employer] Meetings, dates discussed:    Additional Comments:  Kermit BaloKelli F Paul Torpey, RN 06/11/2017, 4:19 PM

## 2017-06-12 ENCOUNTER — Non-Acute Institutional Stay (SKILLED_NURSING_FACILITY): Payer: Medicare Other | Admitting: Internal Medicine

## 2017-06-12 ENCOUNTER — Encounter: Payer: Self-pay | Admitting: Internal Medicine

## 2017-06-12 DIAGNOSIS — E034 Atrophy of thyroid (acquired): Secondary | ICD-10-CM | POA: Diagnosis not present

## 2017-06-12 DIAGNOSIS — I63511 Cerebral infarction due to unspecified occlusion or stenosis of right middle cerebral artery: Secondary | ICD-10-CM | POA: Diagnosis not present

## 2017-06-12 DIAGNOSIS — F329 Major depressive disorder, single episode, unspecified: Secondary | ICD-10-CM

## 2017-06-12 DIAGNOSIS — E785 Hyperlipidemia, unspecified: Secondary | ICD-10-CM | POA: Diagnosis not present

## 2017-06-12 DIAGNOSIS — G8194 Hemiplegia, unspecified affecting left nondominant side: Secondary | ICD-10-CM | POA: Diagnosis not present

## 2017-06-12 DIAGNOSIS — H3530 Unspecified macular degeneration: Secondary | ICD-10-CM

## 2017-06-12 DIAGNOSIS — I639 Cerebral infarction, unspecified: Secondary | ICD-10-CM | POA: Diagnosis not present

## 2017-06-12 DIAGNOSIS — R4701 Aphasia: Secondary | ICD-10-CM

## 2017-06-12 DIAGNOSIS — I951 Orthostatic hypotension: Secondary | ICD-10-CM

## 2017-06-12 DIAGNOSIS — I6381 Other cerebral infarction due to occlusion or stenosis of small artery: Secondary | ICD-10-CM

## 2017-06-12 DIAGNOSIS — F32A Depression, unspecified: Secondary | ICD-10-CM

## 2017-06-13 ENCOUNTER — Encounter: Payer: Self-pay | Admitting: Internal Medicine
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
## 2017-06-14 DIAGNOSIS — F329 Major depressive disorder, single episode, unspecified: Secondary | ICD-10-CM | POA: Insufficient documentation

## 2017-06-14 DIAGNOSIS — F32A Depression, unspecified: Secondary | ICD-10-CM | POA: Insufficient documentation

## 2017-06-14 DIAGNOSIS — H3530 Unspecified macular degeneration: Secondary | ICD-10-CM | POA: Insufficient documentation

## 2017-06-16 LAB — CBC AND DIFFERENTIAL
HCT: 39 (ref 36–46)
Hemoglobin: 12.8 (ref 12.0–16.0)
Platelets: 311 (ref 150–399)
WBC: 11.7

## 2017-06-16 LAB — BASIC METABOLIC PANEL
BUN: 20 (ref 4–21)
CREATININE: 0.9 (ref 0.5–1.1)
GLUCOSE: 102
POTASSIUM: 4.3 (ref 3.4–5.3)
Sodium: 145 (ref 137–147)

## 2017-07-02 ENCOUNTER — Emergency Department (HOSPITAL_COMMUNITY): Payer: Medicare Other

## 2017-07-02 ENCOUNTER — Encounter (HOSPITAL_COMMUNITY): Payer: Self-pay | Admitting: Internal Medicine

## 2017-07-02 ENCOUNTER — Inpatient Hospital Stay (HOSPITAL_COMMUNITY)
Admission: EM | Admit: 2017-07-02 | Discharge: 2017-07-10 | DRG: 872 | Disposition: E | Payer: Medicare Other | Attending: Family Medicine | Admitting: Family Medicine

## 2017-07-02 DIAGNOSIS — E785 Hyperlipidemia, unspecified: Secondary | ICD-10-CM | POA: Diagnosis present

## 2017-07-02 DIAGNOSIS — Z79899 Other long term (current) drug therapy: Secondary | ICD-10-CM | POA: Diagnosis not present

## 2017-07-02 DIAGNOSIS — Z888 Allergy status to other drugs, medicaments and biological substances status: Secondary | ICD-10-CM | POA: Diagnosis not present

## 2017-07-02 DIAGNOSIS — N185 Chronic kidney disease, stage 5: Secondary | ICD-10-CM

## 2017-07-02 DIAGNOSIS — E860 Dehydration: Secondary | ICD-10-CM | POA: Diagnosis present

## 2017-07-02 DIAGNOSIS — E034 Atrophy of thyroid (acquired): Secondary | ICD-10-CM | POA: Diagnosis not present

## 2017-07-02 DIAGNOSIS — Z515 Encounter for palliative care: Secondary | ICD-10-CM | POA: Diagnosis present

## 2017-07-02 DIAGNOSIS — I10 Essential (primary) hypertension: Secondary | ICD-10-CM | POA: Diagnosis present

## 2017-07-02 DIAGNOSIS — J90 Pleural effusion, not elsewhere classified: Secondary | ICD-10-CM | POA: Diagnosis present

## 2017-07-02 DIAGNOSIS — I4891 Unspecified atrial fibrillation: Secondary | ICD-10-CM | POA: Diagnosis present

## 2017-07-02 DIAGNOSIS — I120 Hypertensive chronic kidney disease with stage 5 chronic kidney disease or end stage renal disease: Secondary | ICD-10-CM | POA: Diagnosis present

## 2017-07-02 DIAGNOSIS — N179 Acute kidney failure, unspecified: Secondary | ICD-10-CM | POA: Diagnosis present

## 2017-07-02 DIAGNOSIS — R6520 Severe sepsis without septic shock: Secondary | ICD-10-CM | POA: Diagnosis present

## 2017-07-02 DIAGNOSIS — R6521 Severe sepsis with septic shock: Secondary | ICD-10-CM | POA: Diagnosis not present

## 2017-07-02 DIAGNOSIS — Z7902 Long term (current) use of antithrombotics/antiplatelets: Secondary | ICD-10-CM | POA: Diagnosis not present

## 2017-07-02 DIAGNOSIS — I69354 Hemiplegia and hemiparesis following cerebral infarction affecting left non-dominant side: Secondary | ICD-10-CM | POA: Diagnosis not present

## 2017-07-02 DIAGNOSIS — E870 Hyperosmolality and hypernatremia: Secondary | ICD-10-CM

## 2017-07-02 DIAGNOSIS — E039 Hypothyroidism, unspecified: Secondary | ICD-10-CM | POA: Diagnosis present

## 2017-07-02 DIAGNOSIS — Z79890 Hormone replacement therapy (postmenopausal): Secondary | ICD-10-CM | POA: Diagnosis not present

## 2017-07-02 DIAGNOSIS — I959 Hypotension, unspecified: Secondary | ICD-10-CM | POA: Diagnosis present

## 2017-07-02 DIAGNOSIS — I639 Cerebral infarction, unspecified: Secondary | ICD-10-CM

## 2017-07-02 DIAGNOSIS — R627 Adult failure to thrive: Secondary | ICD-10-CM | POA: Diagnosis present

## 2017-07-02 DIAGNOSIS — A419 Sepsis, unspecified organism: Principal | ICD-10-CM | POA: Diagnosis present

## 2017-07-02 DIAGNOSIS — Z7982 Long term (current) use of aspirin: Secondary | ICD-10-CM | POA: Diagnosis not present

## 2017-07-02 DIAGNOSIS — Z66 Do not resuscitate: Secondary | ICD-10-CM | POA: Diagnosis present

## 2017-07-02 DIAGNOSIS — F0390 Unspecified dementia without behavioral disturbance: Secondary | ICD-10-CM | POA: Diagnosis present

## 2017-07-02 DIAGNOSIS — G8194 Hemiplegia, unspecified affecting left nondominant side: Secondary | ICD-10-CM | POA: Diagnosis present

## 2017-07-02 DIAGNOSIS — E875 Hyperkalemia: Secondary | ICD-10-CM | POA: Diagnosis present

## 2017-07-02 DIAGNOSIS — I6990 Unspecified sequelae of unspecified cerebrovascular disease: Secondary | ICD-10-CM

## 2017-07-02 DIAGNOSIS — F419 Anxiety disorder, unspecified: Secondary | ICD-10-CM | POA: Diagnosis present

## 2017-07-02 DIAGNOSIS — Z882 Allergy status to sulfonamides status: Secondary | ICD-10-CM

## 2017-07-02 LAB — I-STAT CHEM 8, ED
BUN: >140 mg/dL — ABNORMAL HIGH (ref 6–20)
CALCIUM ION: 1.21 mmol/L (ref 1.15–1.40)
Chloride: 133 mmol/L (ref 101–111)
Creatinine, Ser: 6.6 mg/dL — ABNORMAL HIGH (ref 0.44–1.00)
GLUCOSE: 223 mg/dL — AB (ref 65–99)
HCT: 50 % — ABNORMAL HIGH (ref 36.0–46.0)
HEMOGLOBIN: 17 g/dL — AB (ref 12.0–15.0)
POTASSIUM: 5.2 mmol/L — AB (ref 3.5–5.1)
SODIUM: 165 mmol/L — AB (ref 135–145)
TCO2: 21 mmol/L — ABNORMAL LOW (ref 22–32)

## 2017-07-02 LAB — CBC WITH DIFFERENTIAL/PLATELET
BASOS PCT: 0 %
Band Neutrophils: 0 %
Basophils Absolute: 0 10*3/uL (ref 0.0–0.1)
Blasts: 0 %
EOS ABS: 0 10*3/uL (ref 0.0–0.7)
EOS PCT: 0 %
HCT: 54.8 % — ABNORMAL HIGH (ref 36.0–46.0)
Hemoglobin: 17 g/dL — ABNORMAL HIGH (ref 12.0–15.0)
LYMPHS ABS: 0.9 10*3/uL (ref 0.7–4.0)
LYMPHS PCT: 3 %
MCH: 31.3 pg (ref 26.0–34.0)
MCHC: 31 g/dL (ref 30.0–36.0)
MCV: 100.7 fL — AB (ref 78.0–100.0)
MONO ABS: 1.1 10*3/uL — AB (ref 0.1–1.0)
Metamyelocytes Relative: 0 %
Monocytes Relative: 4 %
Myelocytes: 0 %
NEUTROS ABS: 26.5 10*3/uL — AB (ref 1.7–7.7)
NEUTROS PCT: 93 %
NRBC: 0 /100{WBCs}
OTHER: 0 %
PLATELETS: ADEQUATE 10*3/uL (ref 150–400)
PROMYELOCYTES ABS: 0 %
RBC: 5.44 MIL/uL — ABNORMAL HIGH (ref 3.87–5.11)
RDW: 14.9 % (ref 11.5–15.5)
WBC: 28.5 10*3/uL — ABNORMAL HIGH (ref 4.0–10.5)

## 2017-07-02 LAB — I-STAT TROPONIN, ED: TROPONIN I, POC: 0.31 ng/mL — AB (ref 0.00–0.08)

## 2017-07-02 LAB — COMPREHENSIVE METABOLIC PANEL
ALK PHOS: 145 U/L — AB (ref 38–126)
ALT: 66 U/L — AB (ref 14–54)
ANION GAP: 23 — AB (ref 5–15)
AST: 43 U/L — ABNORMAL HIGH (ref 15–41)
Albumin: 3.4 g/dL — ABNORMAL LOW (ref 3.5–5.0)
BUN: 234 mg/dL — ABNORMAL HIGH (ref 6–20)
CALCIUM: 10.4 mg/dL — AB (ref 8.9–10.3)
CO2: 17 mmol/L — AB (ref 22–32)
Chloride: 124 mmol/L — ABNORMAL HIGH (ref 101–111)
Creatinine, Ser: 6.61 mg/dL — ABNORMAL HIGH (ref 0.44–1.00)
GFR calc non Af Amer: 5 mL/min — ABNORMAL LOW (ref >60)
GFR, EST AFRICAN AMERICAN: 6 mL/min — AB (ref >60)
GLUCOSE: 226 mg/dL — AB (ref 65–99)
Potassium: 5.3 mmol/L — ABNORMAL HIGH (ref 3.5–5.1)
SODIUM: 164 mmol/L — AB (ref 135–145)
Total Bilirubin: 1.4 mg/dL — ABNORMAL HIGH (ref 0.3–1.2)
Total Protein: 7.3 g/dL (ref 6.5–8.1)

## 2017-07-02 LAB — I-STAT CG4 LACTIC ACID, ED
LACTIC ACID, VENOUS: 2.32 mmol/L — AB (ref 0.5–1.9)
Lactic Acid, Venous: 3.8 mmol/L (ref 0.5–1.9)

## 2017-07-02 MED ORDER — PIPERACILLIN-TAZOBACTAM IN DEX 2-0.25 GM/50ML IV SOLN
2.2500 g | Freq: Three times a day (TID) | INTRAVENOUS | Status: DC
  Filled 2017-07-02: qty 50

## 2017-07-02 MED ORDER — MORPHINE SULFATE (CONCENTRATE) 10 MG/0.5ML PO SOLN: 5.0000 mg | ORAL | Status: DC | PRN

## 2017-07-02 MED ORDER — ONDANSETRON 4 MG PO TBDP: 4.0000 mg | ORAL_TABLET | Freq: Four times a day (QID) | ORAL | Status: DC | PRN

## 2017-07-02 MED ORDER — SODIUM CHLORIDE 0.9 % IV BOLUS (SEPSIS)
1000.0000 mL | Freq: Once | INTRAVENOUS | Status: AC
  Administered 2017-07-02: 1000 mL via INTRAVENOUS

## 2017-07-02 MED ORDER — BISACODYL 10 MG RE SUPP: 10.0000 mg | Freq: Every day | RECTAL | Status: DC | PRN

## 2017-07-02 MED ORDER — ALUM & MAG HYDROXIDE-SIMETH 200-200-20 MG/5ML PO SUSP: 30.0000 mL | Freq: Four times a day (QID) | ORAL | Status: DC | PRN

## 2017-07-02 MED ORDER — HALOPERIDOL LACTATE 2 MG/ML PO CONC: 0.5000 mg | ORAL | Status: DC | PRN

## 2017-07-02 MED ORDER — ACETAMINOPHEN 325 MG PO TABS: 650.0000 mg | ORAL_TABLET | Freq: Four times a day (QID) | ORAL | Status: DC | PRN

## 2017-07-02 MED ORDER — LORAZEPAM 2 MG/ML IJ SOLN: 1.0000 mg | INTRAMUSCULAR | Status: DC | PRN

## 2017-07-02 MED ORDER — MORPHINE SULFATE (PF) 4 MG/ML IV SOLN: 1.0000 mg | INTRAVENOUS | Status: DC | PRN

## 2017-07-02 MED ORDER — POLYVINYL ALCOHOL 1.4 % OP SOLN: 1.0000 [drp] | Freq: Four times a day (QID) | OPHTHALMIC | Status: DC | PRN

## 2017-07-02 MED ORDER — HALOPERIDOL LACTATE 5 MG/ML IJ SOLN: 0.5000 mg | INTRAMUSCULAR | Status: DC | PRN

## 2017-07-02 MED ORDER — GLYCOPYRROLATE 0.2 MG/ML IJ SOLN: 0.2000 mg | INTRAMUSCULAR | Status: DC | PRN

## 2017-07-02 MED ORDER — GLYCOPYRROLATE 1 MG PO TABS
1.0000 mg | ORAL_TABLET | ORAL | Status: DC | PRN
Start: 2017-07-02 — End: 2017-07-05

## 2017-07-02 MED ORDER — SODIUM CHLORIDE 0.9 % IV BOLUS (SEPSIS)
250.0000 mL | Freq: Once | INTRAVENOUS | Status: AC
  Administered 2017-07-02: 250 mL via INTRAVENOUS

## 2017-07-02 MED ORDER — PANTOPRAZOLE SODIUM 40 MG PO TBEC: 40.0000 mg | DELAYED_RELEASE_TABLET | Freq: Every day | ORAL | Status: DC

## 2017-07-02 MED ORDER — VANCOMYCIN HCL 10 G IV SOLR
1500.0000 mg | Freq: Once | INTRAVENOUS | Status: AC
  Administered 2017-07-02: 1500 mg via INTRAVENOUS
  Filled 2017-07-02: qty 1500

## 2017-07-02 MED ORDER — ALBUTEROL SULFATE (2.5 MG/3ML) 0.083% IN NEBU: 2.5000 mg | INHALATION_SOLUTION | RESPIRATORY_TRACT | Status: DC | PRN

## 2017-07-02 MED ORDER — SODIUM CHLORIDE 0.9% FLUSH
3.0000 mL | Freq: Two times a day (BID) | INTRAVENOUS | Status: DC
  Administered 2017-07-03 – 2017-07-04 (×3): 3 mL via INTRAVENOUS

## 2017-07-02 MED ORDER — HALOPERIDOL 1 MG PO TABS: 0.5000 mg | ORAL_TABLET | ORAL | Status: DC | PRN

## 2017-07-02 MED ORDER — RESOURCE THICKENUP CLEAR PO POWD: 1.0000 | ORAL | Status: DC | PRN

## 2017-07-02 MED ORDER — LORAZEPAM 1 MG PO TABS: 1.0000 mg | ORAL_TABLET | ORAL | Status: DC | PRN

## 2017-07-02 MED ORDER — ACETAMINOPHEN 650 MG RE SUPP: 650.0000 mg | Freq: Four times a day (QID) | RECTAL | Status: DC | PRN

## 2017-07-02 MED ORDER — VANCOMYCIN HCL IN DEXTROSE 1-5 GM/200ML-% IV SOLN: 1000.0000 mg | Freq: Once | INTRAVENOUS | Status: DC

## 2017-07-02 MED ORDER — DIPHENHYDRAMINE HCL 50 MG/ML IJ SOLN: 12.5000 mg | INTRAMUSCULAR | Status: DC | PRN

## 2017-07-02 MED ORDER — LORAZEPAM 2 MG/ML PO CONC: 1.0000 mg | ORAL | Status: DC | PRN

## 2017-07-02 MED ORDER — ONDANSETRON HCL 4 MG/2ML IJ SOLN: 4.0000 mg | Freq: Four times a day (QID) | INTRAMUSCULAR | Status: DC | PRN

## 2017-07-02 MED ORDER — SODIUM CHLORIDE 0.9% FLUSH: 3.0000 mL | INTRAVENOUS | Status: DC | PRN

## 2017-07-02 MED ORDER — SODIUM CHLORIDE 0.9 % IV SOLN: 12.5000 mg | Freq: Four times a day (QID) | INTRAVENOUS | Status: DC | PRN

## 2017-07-02 MED ORDER — SODIUM CHLORIDE 0.9 % IV SOLN
250.0000 mL | INTRAVENOUS | Status: DC | PRN
  Administered 2017-07-04: 250 mL via INTRAVENOUS

## 2017-07-02 MED ORDER — PIPERACILLIN-TAZOBACTAM 3.375 G IVPB 30 MIN
3.3750 g | Freq: Once | INTRAVENOUS | Status: AC
  Administered 2017-07-02: 3.375 g via INTRAVENOUS
  Filled 2017-07-02: qty 50

## 2017-07-02 MED ORDER — BIOTENE DRY MOUTH MT LIQD: 15.0000 mL | OROMUCOSAL | Status: DC | PRN

## 2017-07-02 NOTE — ED Triage Notes (Signed)
Patient from [facility] for altered mental status x1 day. History of AMS after CVA in December, facility reports patient is less responsive than baseline. Currently patient very lethargic, minimally responsive to pain. Does not follow commands, heart rate 140-150's and irregular with EMS, BP 80/40, CBG 277. EMS unable to obtain IV access. MD at bedside.

## 2017-07-02 NOTE — ED Notes (Signed)
Dr.Yao shown results of Chem and lactic Acid. ED-Lab

## 2017-07-02 NOTE — H&P (Signed)
History and Physical    Lindsey Rivera RUE:[id number] DOB: 01/18/1925 DOA: 07/09/2017  PCP: Clovis RileyMitchell, L.August Saucerean, MD Patient coming from:   I have personally briefly reviewed patient's [facility]  Chief Complaint: Altered mental status  HPI: Lindsey FetchDorothy C Chasen is a 84 y.o. female with medical history significant of chronic kidney disease stage III with recent right basal ganglia stroke and evolution with discharge on June 06, 2017 with readmission on June 08, 2017.   Since discharge from the hospital with her stroke diagnosis the patient has been living at a skilled nursing facility where she has not been participating in activities, she has been refusing to eat, and she has been for the most part refusing to communicate with staff members.  She does speak with her son on occasion.  He has not been drinking much or participating in events.  Her son feels that she has recently given up and is very unhappy having been left with deficits including speech and some mobility.  She has been telling him recently that she just wants to "go".  Her husband died 4 years ago and prior to that she had cared for him for 18 years.  She had quit her job to care for him.  Since death she has been independently driving living alone and playing the organ at church and caring for herself.  Since her stroke she has become entirely dependent and now lives in a nursing facility which makes her very upset.  Today the patient presented with atrial fibrillation with a heart rate of 150 and rapid ventricular response, hypotension in fact they had difficulty getting a blood pressure initially at all, hypothermia with a temperature of 95 degrees, a sodium of 164, elevated LFTs, a possible pneumonia on chest x-ray, and worsening stroke per CT scanning.  She was diagnosed initially with severe sepsis and treated aggressively with IV fluids and antibiotics.  When her son arrived he confirmed her DNR  status and informed of her condition.  Given that she is unlikely to return to any level of function with which she would be satisfied, her son has requested palliative care.  He states that his mother wishes for no further aggressive management including IV fluids.  I did discuss with him nutrition as he had inquired about tube feeding and is now opted against tube feeding as he does not wish to prolong her condition.  She will be admitted into the hospital for palliative care.  We will consult hospice and palliative medicine for symptom control.   Review of Systems: Patient would not answer any questions and unable to participate.  Past Medical History:  Diagnosis Date  . Arthritis   . Stroke (HCC)   . Thyroid disease     Past Surgical History:  Procedure Laterality Date  . ABDOMINAL HYSTERECTOMY       reports that  has never smoked. she has never used smokeless tobacco. She reports that she does not drink alcohol or use drugs.  Allergies  Allergen Reactions  . Darvon [Propoxyphene] Other (See Comments)    unknown  . Sulfa Antibiotics Palpitations    Family History  Family history unknown: Yes     Prior to Admission medications   Medication Sig Start Date End Date Taking? Authorizing Provider  aspirin 325 MG tablet Take 1 tablet (325 mg total) by mouth daily. 06/07/17  Yes Mikhail, Nita SellsMaryann, DO  atorvastatin (LIPITOR) 40 MG tablet Take 1 tablet (40 mg total)  by mouth daily at 6 PM. 06/06/17  Yes Mikhail, [place], DO  calcium-vitamin D (OSCAL WITH D) 500-200 MG-UNIT tablet Take 1 tablet by mouth daily with breakfast.   Yes [provider]  clopidogrel (PLAVIX) 75 MG tablet Take 1 tablet (75 mg total) by mouth daily. 06/07/17  Yes Mikhail, Nita Sells, DO  FLUoxetine (PROZAC) 10 MG capsule Take 10 mg by mouth daily.   Yes [provider]  levothyroxine (SYNTHROID, LEVOTHROID) 50 MCG tablet Take 50 mcg by mouth daily before breakfast.   Yes [provider]   Maltodextrin-Xanthan Gum (RESOURCE THICKENUP CLEAR) POWD Take 120 g by mouth as needed. 06/11/17  Yes Berton Mount I, MD  mirtazapine (REMERON) 7.5 MG tablet Take 7.5 mg by mouth at bedtime.   Yes [provider]  montelukast (SINGULAIR) 10 MG tablet Take 10 mg by mouth at bedtime.   Yes [provider]  Multiple Vitamins-Minerals (PRESERVISION AREDS PO) Take 1 tablet by mouth 2 (two) times daily.    Yes [provider]  prednisoLONE acetate (PRED FORTE) 1 % ophthalmic suspension Place 1 drop into the left eye every 2 (two) hours while awake.   Yes [provider]  thiamine 100 MG tablet Take 100 mg by mouth daily.   Yes [provider]  timolol (BETIMOL) 0.5 % ophthalmic solution Place 1 drop into the left eye daily.   Yes [provider]    Physical Exam: Vitals:   06/22/2017 1030 07/04/2017 1045 06/30/2017 1115 07/04/2017 1145  BP: 99/74 (!) 84/48 107/76 107/90  Pulse:    (!) 112  Resp: (!) 21 (!) 21 20 20   Temp:      TempSrc:      SpO2:   98% 98%  Weight:      Height:       .TCS Constitutional: NAD, calm, comfortable Vitals:   06/27/2017 1030 06/28/2017 1045 07/03/2017 1115 06/09/2017 1145  BP: 99/74 (!) 84/48 107/76 107/90  Pulse:    (!) 112  Resp: (!) 21 (!) 21 20 20   Temp:      TempSrc:      SpO2:   98% 98%  Weight:      Height:       Eyes:  lids and conjunctivae normal, PERRL ENMT: Mucous membranes are dry. Posterior pharynx clear of any exudate or lesions.Normal dentition.  Neck: normal, supple, no masses, no thyromegaly Respiratory: clear to auscultation bilaterally, no wheezing, no crackles. Normal respiratory effort. No accessory muscle use.  Cardiovascular: Tachycardia and irregularly irregular, no murmurs / rubs / gallops. No extremity edema. . No carotid bruits.  Abdomen: no tenderness, no masses palpated. No hepatosplenomegaly. Bowel sounds positive.  Musculoskeletal: no clubbing / cyanosis. No joint deformity upper  and lower extremities. Good ROM, no contractures. Normal muscle tone.  Skin: no rashes, lesions, ulcers. No induration Neurologic: Patient with her eyes open does not follow commands, does not respond to questions, she had some spontaneous speech which I could not understand.     Labs on Admission: I have personally reviewed following labs and imaging studies  CBC: Recent Labs  Lab 06/11/2017 0948 06/19/2017 1004  WBC 28.5*  --   NEUTROABS 26.5*  --   HGB 17.0* 17.0*  HCT 54.8* 50.0*  MCV 100.7*  --   PLT PLATELET CLUMPS NOTED ON SMEAR, COUNT APPEARS ADEQUATE  --    Basic Metabolic Panel: Recent Labs  Lab 06/14/2017 0948 07/04/2017 1004  NA 164* 165*  K 5.3* 5.2*  CL 124* 133*  CO2 17*  --   GLUCOSE 226* 223*  BUN 234* >140*  CREATININE 6.61* 6.60*  CALCIUM 10.4*  --    GFR: Estimated Creatinine Clearance: 5 mL/min (A) (by C-G formula based on SCr of 6.6 mg/dL (H)). Liver Function Tests: Recent Labs  Lab 06/27/2017 0948  AST 43*  ALT 66*  ALKPHOS 145*  BILITOT 1.4*  PROT 7.3  ALBUMIN 3.4*   No results for input(s): LIPASE, AMYLASE in the last 168 hours. No results for input(s): AMMONIA in the last 168 hours. Coagulation Profile: No results for input(s): INR, PROTIME in the last 168 hours. Cardiac Enzymes: No results for input(s): CKTOTAL, CKMB, CKMBINDEX, TROPONINI in the last 168 hours. BNP (last 3 results) No results for input(s): PROBNP in the last 8760 hours. HbA1C: No results for input(s): HGBA1C in the last 72 hours. CBG: No results for input(s): GLUCAP in the last 168 hours. Lipid Profile: No results for input(s): CHOL, HDL, LDLCALC, TRIG, CHOLHDL, LDLDIRECT in the last 72 hours. Thyroid Function Tests: No results for input(s): TSH, T4TOTAL, FREET4, T3FREE, THYROIDAB in the last 72 hours. Anemia Panel: No results for input(s): VITAMINB12, FOLATE, FERRITIN, TIBC, IRON, RETICCTPCT in the last 72 hours. Urine analysis:    Component Value Date/Time    COLORURINE STRAW (A) 06/04/2017 0944   APPEARANCEUR CLEAR 06/04/2017 0944   LABSPEC 1.010 06/04/2017 0944   PHURINE 5.0 06/04/2017 0944   GLUCOSEU NEGATIVE 06/04/2017 0944   HGBUR NEGATIVE 06/04/2017 0944   BILIRUBINUR NEGATIVE 06/04/2017 0944   KETONESUR NEGATIVE 06/04/2017 0944   PROTEINUR NEGATIVE 06/04/2017 0944   NITRITE NEGATIVE 06/04/2017 0944   LEUKOCYTESUR NEGATIVE 06/04/2017 0944    Radiological Exams on Admission: Ct Head Wo Contrast  Result Date: 06/21/2017 CLINICAL DATA:  Altered mental status. Unresponsive. Recent stroke. EXAM: CT HEAD WITHOUT CONTRAST TECHNIQUE: Contiguous axial images were obtained from the base of the skull through the vertex without intravenous contrast. COMPARISON:  Brain MRI 06/10/2017 FINDINGS: Brain: There is a subacute infarct involving the right basal ganglia and corona radiata, acute on subacute on the prior MRI. Edema anteriorly in the right basal ganglia has decreased compared to the prior MRI. A small amount of hyperattenuation along the inferolateral greater than superomedial margins of the infarct may represent small volume blood products/confluent petechial hemorrhage, mineralization, or a combination of both. This corresponds to T1 hyperintensity on the prior MRI and has not significantly increased in volume. The infarct may have mildly expanded posteriorly at the corona radiata level compared to the prior MRI. Patchy low-density involving primarily white matter of the posterior right temporal and parietal lobes corresponds to evolving infarcts seen on the prior MRI. No acute infarct is identified in the left cerebral hemisphere or posterior fossa. There is a background of mild chronic small vessel ischemia in the cerebral white matter. No midline shift or extra-axial fluid collection is seen. Overall cerebral volume is within normal limits for age. Vascular: Calcified atherosclerosis at the skull base. No hyperdense vessel. Skull: No fracture or focal  osseous lesion. Sinuses/Orbits: Paranasal sinuses and mastoid air cells are clear. Bilateral cataract extraction. Other: None. IMPRESSION: Evolving right basal ganglia infarct with decreased edema and small volume petechial blood products and/or mineralization as above. Likely mild posterior extension of infarct in the corona radiata compared to the prior MRI. Electronically Signed   By: Sebastian Ache M.D.   On: 07/06/2017 10:30   Dg Chest Port 1 View  Result Date: 06/27/2017 CLINICAL DATA:  Altered mental status.  Code sepsis. EXAM: PORTABLE CHEST 1 VIEW COMPARISON:  06/10/2017 FINDINGS: Mild cardiac enlargement. Unchanged left pleural effusion. No airspace opacities or atelectasis. IMPRESSION: 1. Unchanged left pleural effusion. Electronically Signed   By: Signa Kell M.D.   On: 2017-07-12 10:26    EKG: Independently reviewed.  Atrial fibrillation with rapid ventricular response with heart rate in the 150s.  Assessment/Plan Principal Problem:   End of life care Active Problems:   Extension of cerebrovascular accident (CVA) (HCC)   Severe sepsis (HCC)   Acute renal failure superimposed on stage 5 chronic kidney disease, not on chronic dialysis (HCC)   Left hemiparesis (HCC)- mild   Late effects of cerebrovascular accident   HLD (hyperlipidemia)   Essential hypertension   Anxiety   Hypothyroidism  Patient presents at the end of life requiring medication and aggressive management for symptom control.  She is septic with a heart rate of 150 white blood cell count of 28,000 and creatinine of 6.6 a sodium of 164 and elevated LFTs.  Her chest x-ray is consistent with pneumonia and her CT scan shows a worsening stroke.  I had extensive discussions with the patient's son who wishes for her to be kept comfortable and does not wish full resuscitative measures.  I am consulting palliative care and hospice for assistance in symptom management and possible placement at the hospice house.   DVT  prophylaxis: Patient is comfort care and does not require it Code Status: DO NOT RESUSCITATE Family Communication: Spoke at length with patient's son who was present in the room Disposition Plan: To hospice care Consults called: Palliative care and hospice Admission status: Inpatient   Lahoma Crocker MD FACP [facility] Pager [phone]  If 7PM-7AM, please contact night-coverage [email or web address] Password [facility]  12-Jul-2017, 11:54 AM

## 2017-07-02 NOTE — Progress Notes (Signed)
Pharmacy Antibiotic Note  [name] is a 84 y.o. female admitted on 07/03/2017 with sepsis/altered mental status.  Pharmacy has been consulted for vancomycin and Zosyn dosing.  AKI with SCr 6.61 and estimated CrCl of 805mL/min.  Baseline SCr 0.9-1.3.  Plan:  Vancomycin 1500 mg load.  Goal trough 15-20 mcg/mL.  Check vanc random in 48 hours, and dose based on levels due to significant changes in renal function.  Zosyn 3.375 grams 30 min IV infusion given in ED. Continue 2.25 grams every 8 hours.   Monitor for clinical progression, renal function and trough as needed.  Temp (24hrs), Avg:95.7 F (35.4 C), Min:95.7 F (35.4 C), Max:95.7 F (35.4 C)  No results for input(s): WBC, CREATININE, LATICACIDVEN, VANCOTROUGH, VANCOPEAK, VANCORANDOM, GENTTROUGH, GENTPEAK, GENTRANDOM, TOBRATROUGH, TOBRAPEAK, TOBRARND, AMIKACINPEAK, AMIKACINTROU, AMIKACIN in the last 168 hours.  CrCl cannot be calculated (Unknown ideal weight.).    Allergies  Allergen Reactions  . Darvon [Propoxyphene] Other (See Comments)    unknown  . Sulfa Antibiotics Palpitations    Antimicrobials this admission: 1/24 vancomycin >>  1/24 Zosyn >>   Dose adjustments this admission: N/A  Microbiology results: 1/24 BCx: x 2 pending 1/24 UCx: pending   Thank you for allowing pharmacy to be a part of this patient's care.  Harlow AsaAmber C Dail Lerew, PharmD 06/21/2017 9:49 AM

## 2017-07-02 NOTE — ED Notes (Signed)
Dr.Yao shown results of troponin level. ED-Lab

## 2017-07-02 NOTE — Progress Notes (Signed)
[facility] . Pt actively dying son and Renato Gailsastor at bedside supporting pt. Son is coping well and has called all his family. [place] from [place] and [place]. Provided support to son ,presence and listening. Chaplain available as needed.   04/02/2018 1244  Clinical Encounter Type  Visited With Patient and family together;Health care provider  Visit Type Initial;Spiritual support;Patient actively dying  Referral From Physician  Spiritual Encounters  Spiritual Needs Emotional;Grief support  Stress Factors  Family Stress Factors Loss;Major life changes  Fae PippinWatlington, Braylen Staller, Chaplain, [facility], Pager [phone]

## 2017-07-02 NOTE — Progress Notes (Signed)
Patient arrived to 6n27, arousable but very drowsy, only responsive to pain/touch. VSS very low, purewick placed on patient, IV intact and saline locked. Patient comfortable, family at bedside, oriented to room and staff, will continue to monitor.
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
## 2017-07-03 ENCOUNTER — Other Ambulatory Visit: Payer: Self-pay

## 2017-07-03 MED ORDER — SODIUM CHLORIDE 0.9 % IV SOLN
12.5000 mg | Freq: Once | INTRAVENOUS | Status: AC
  Administered 2017-07-03: 12.5 mg via INTRAVENOUS
  Filled 2017-07-03: qty 0.5

## 2017-07-03 MED ORDER — MORPHINE SULFATE (CONCENTRATE) 10 MG/0.5ML PO SOLN
5.0000 mg | Freq: Three times a day (TID) | ORAL | Status: DC
  Administered 2017-07-03 – 2017-07-04 (×4): 5 mg via SUBLINGUAL
  Filled 2017-07-03 (×4): qty 0.5

## 2017-07-03 MED ORDER — MORPHINE SULFATE (PF) 4 MG/ML IV SOLN: 1.0000 mg | INTRAVENOUS | Status: DC | PRN

## 2017-07-03 NOTE — Progress Notes (Signed)
[facility] PROGRESS NOTE  Lindsey FetchDorothy C Morken  AVW:[id number] DOB: 09/09/1924 DOA: 10-16-17 PCP: Clovis RileyMitchell, L.August Saucerean, MD  Brief Narrative: Lindsey Rivera is a 84 y.o. 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 She had been living independently for 3 years since her husband's death, who she cared for for 18 years. Since the residual deficits following the CVA left her without that independence, she consistently reported that she no longer wished to live. She was brought to the ED when she was poorly responsive, hypothermic with undetectable blood pressure, found to be in rapid AFib with BPs rising to 60/29 in the ED after volume resuscitation. Anuric kidney failure was noted with a BUN of 234 and creatinine of 6.61 as well as sodium of 164 and potassium of 5.3, all changed from normal values at recent discharge. CT head showed evolving right basal ganglia infarct and mild posterior extension of the infarct. Goals of care was discussed with her son who requested comfort measures only. She was admitted, antibiotics initially provided were stopped, and her family was called to be with her in the hospital.  Subjective: Unresponsive. No events reported by family in the room.   Objective: BP 91/78 (BP Location: Left Arm)   Pulse (!) 103   Temp (!) 97.3 F (36.3 C) (Axillary)   Resp 17   Ht 5' (1.524 m)   Wt 74.8 kg (165 lb)   SpO2 (!) 83%   BMI 32.22 kg/m   Gen: Frail, elderly female laying with mouth open Pulm: Nonlabored with supplemental oxygen  CV: Rapid, irregular, no JVD.  GI: Soft, nondistended Neuro: Moves arms slightly to some touch, does not verbalize and holds eyelids shut when attempting to open them.  Ext: No deformities  Assessment & Plan: End of life care: Pt with severe failure to thrive following recent CVA and presenting with severe dehydration, anuric renal  failure, uremia, hyperkalemia, hypernatremia, and suspected sepsis, possibly due to UTI, though this cannot be clinically determined with no urine output. The presence of this infection is not particularly significant as she is complete comfort care per her consistent wishes.  - Comfort care orders placed, palliative care starting scheduled morphine in addition to other prns.  - Discussed at length with family, particularly pt's son.  - I do not believe the patient is stable for transfer to residential hospice, and anticipate inpatient death.   Hazeline Junkeryan Grunz, MD [facility] Pager [phone]  If 7PM-7AM, please contact night-coverage [email or web address] Password [email or web address] 07/03/2017, 4:33 PM

## 2017-07-03 NOTE — Progress Notes (Signed)
Pt for comfort care, family at the bedside, flat affect, arousable and breathing , no s/s of distress noted.
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
## 2017-07-03 NOTE — Progress Notes (Signed)
Nutrition Brief Note  Chart reviewed. Pt now transitioning to comfort care.  No further nutrition interventions warranted at this time.  Please re-consult as needed.   Romney Compean A. Draven Laine, RD, LDN, CDE Pager: [phone] After hours Pager: [phone]  

## 2017-07-04 DIAGNOSIS — N185 Chronic kidney disease, stage 5: Secondary | ICD-10-CM

## 2017-07-04 DIAGNOSIS — E034 Atrophy of thyroid (acquired): Secondary | ICD-10-CM

## 2017-07-04 DIAGNOSIS — Z515 Encounter for palliative care: Secondary | ICD-10-CM

## 2017-07-04 DIAGNOSIS — N179 Acute kidney failure, unspecified: Secondary | ICD-10-CM

## 2017-07-04 DIAGNOSIS — R6521 Severe sepsis with septic shock: Secondary | ICD-10-CM

## 2017-07-04 DIAGNOSIS — A419 Sepsis, unspecified organism: Principal | ICD-10-CM

## 2017-07-04 DIAGNOSIS — I639 Cerebral infarction, unspecified: Secondary | ICD-10-CM

## 2017-07-04 MED ORDER — MORPHINE SULFATE (PF) 4 MG/ML IV SOLN
1.0000 mg | INTRAVENOUS | Status: DC
  Administered 2017-07-04 – 2017-07-05 (×5): 1 mg via INTRAVENOUS
  Filled 2017-07-04 (×5): qty 1

## 2017-07-04 MED ORDER — GLYCOPYRROLATE 0.2 MG/ML IJ SOLN
0.2000 mg | Freq: Three times a day (TID) | INTRAMUSCULAR | Status: DC
  Administered 2017-07-04 (×2): 0.2 mg via INTRAVENOUS
  Filled 2017-07-04 (×2): qty 1

## 2017-07-04 NOTE — Progress Notes (Signed)
[facility] PROGRESS NOTE  Lindsey Rivera  ZOX:[id number] DOB: 09/24/1924 DOA: 2017-08-11 PCP: Clovis RileyMitchell, L.August Saucerean, MD  Brief Narrative: Lindsey Rivera is a 84 y.o. 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 She had been living independently for 3 years since her husband's death, who she cared for for 18 years. Since the residual deficits following the CVA left her without that independence, she consistently reported that she no longer wished to live. She was brought to the ED when she was poorly responsive, hypothermic with undetectable blood pressure, found to be in rapid AFib with BPs rising to 60/29 in the ED after volume resuscitation. Anuric kidney failure was noted with a BUN of 234 and creatinine of 6.61 as well as sodium of 164 and potassium of 5.3, all changed from normal values at recent discharge. CT head showed evolving right basal ganglia infarct and mild posterior extension of the infarct. Goals of care was discussed with her son who requested comfort measures only. She was admitted, antibiotics initially provided were stopped, and her family was called to be with her in the hospital.  Subjective: Unresponsive. Developed secretions and had some vocalizing that appeared to represent distress/pain earlier, improved with morphine.   Objective: BP (!) 136/47 (BP Location: Right Arm)   Pulse 100   Temp (!) 97.3 F (36.3 C) (Axillary)   Resp 18   Ht 5' (1.524 m)   Wt 74.8 kg (165 lb)   SpO2 97%   BMI 32.22 kg/m   Gen: Frail, elderly female laying with mouth open Neuro: Not meaningfully responsive.  Assessment & Plan: End of life care: Pt with severe failure to thrive following recent CVA and presenting with severe dehydration, anuric renal failure, uremia, hyperkalemia, hypernatremia, and suspected sepsis, possibly due to UTI, though this cannot be clinically  determined with no urine output. The presence of this infection is not particularly significant as she is complete comfort care per her consistent wishes.  - Continue morphine scheduled and prn for grimacing/evidence of pain as well as for dyspnea.  - Scheduling robinul for secretions.  - Discussed with daughter at bedside.  - Appreciate palliative care involvement, not stable for transfer and anticipate inpatient death.  Lindsey Junkeryan Jolan Upchurch, MD [facility] Pager [phone]  If 7PM-7AM, please contact night-coverage [email or web address] Password [email or web address] 07/04/2017, 2:23 PM

## 2017-07-04 NOTE — Social Work (Signed)
CSW acknowledging consult for residential hospice transfer. Per MD and PMT notes, pt is not stable for transfer at this time.   CSW signing off. Please consult if any additional needs arise.  Doy HutchingIsabel H Cleatus Goodin, LCSWA [facility] Clinical Social Work [phone]
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
## 2017-07-05 DIAGNOSIS — E870 Hyperosmolality and hypernatremia: Secondary | ICD-10-CM

## 2017-07-07 LAB — CULTURE, BLOOD (ROUTINE X 2)
CULTURE: NO GROWTH
CULTURE: NO GROWTH
Special Requests: ADEQUATE
Special Requests: ADEQUATE

## 2017-07-10 NOTE — Death Summary Note (Signed)
DEATH SUMMARY   Patient Details  Name: Lindsey Rivera MRN: [id number] DOB: Jan 22, 1925  Admission/Discharge Information   Admit Date:  2017/07/17  Date of Death: Date of Death: 07/20/2017  Time of Death: Time of Death: 0454  Length of Stay: 3  Referring Physician: Clovis Riley, L.August Saucer, MD   Reason(s) for Hospitalization  Failure to thrive, acute renal failure  Diagnoses  Preliminary cause of death:  Secondary Diagnoses (including complications and co-morbidities):  Principal Problem:   End of life care Active Problems:   HLD (hyperlipidemia)   Cerebrovascular accident (CVA) (HCC)   Left hemiparesis (HCC)- mild   Essential hypertension   Anxiety   Hypothyroidism   Extension of cerebrovascular accident (CVA) (HCC)   Severe sepsis (HCC)   Acute renal failure superimposed on stage 5 chronic kidney disease, not on chronic dialysis (HCC)   Late effects of cerebrovascular accident   Terminal care   Palliative care by specialist   Brief Hospital Course (including significant findings, care, treatment, and services provided and events leading to death)  Lindsey Rivera is a 84 y.o. 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 She had been living independently for 3 years since her husband's death, who she cared for for 18 years. Since the residual deficits following the CVA left her without that independence, she consistently reported that she no longer wished to live. She was brought to the ED when she was poorly responsive, hypothermic with undetectable blood pressure, found to be in rapid AFib with BPs rising to 60/29 in the ED after volume resuscitation. Anuric kidney failure was noted with a BUN of 234 and creatinine of 6.61 as well as sodium of 164 and potassium of 5.3, all changed from normal values at recent discharge. CT head showed evolving right basal ganglia infarct and  mild posterior extension of the infarct. Goals of care was discussed with her son who requested comfort measures only. She was admitted, antibiotics initially provided were stopped, and her family was called to be with her in the hospital until she ultimately passed 07-20-17.  Pertinent Labs and Studies  Significant Diagnostic Studies Ct Angio Head W Or Wo Contrast  Result Date: 06/08/2017 CLINICAL DATA:  84 y/o  F; code stroke for follow-up. EXAM: CT ANGIOGRAPHY HEAD AND NECK TECHNIQUE: Multidetector CT imaging of the head and neck was performed using the standard protocol during bolus administration of intravenous contrast. Multiplanar CT image reconstructions and MIPs were obtained to evaluate the vascular anatomy. Carotid stenosis measurements (when applicable) are obtained utilizing NASCET criteria, using the distal internal carotid diameter as the denominator. CONTRAST:  60mL ISOVUE-370 IOPAMIDOL (ISOVUE-370) INJECTION 76% COMPARISON:  06/08/2017 CT head. 06/04/2017 CT angiogram head and neck. FINDINGS: CTA NECK FINDINGS Aortic arch: Standard branching. Imaged portion shows no evidence of aneurysm or dissection. No significant stenosis of the major arch vessel origins. Mild calcific atherosclerosis. Right carotid system: No evidence of dissection, stenosis (50% or greater) or occlusion. Mild non stenotic calcific atherosclerosis of carotid siphon. Left carotid system: No evidence of dissection, stenosis (50% or greater) or occlusion. Mild non stenotic calcific atherosclerosis of carotid siphon. Severe stenosis of left external carotid origin. Vertebral arteries: Codominant. No evidence of dissection, stenosis (50% or greater) or occlusion. Motion related left vertebral artery origin. Skeleton: Moderate cervical spondylosis with prominent facet arthropathy. No high-grade canal stenosis. Other neck: Negative. Upper chest: Negative. Review of the  MIP images confirms the above findings CTA HEAD FINDINGS  Anterior circulation: Stable right proximal M1 occlusion within mid right M1 reconstitution. No new large vessel occlusion identified. Calcific atherosclerosis of carotid siphons without significant stenosis. Stable left ICA aneurysm of distal cavernous segment projecting superiorly and laterally measuring 4 mm to dome and 4 mm in diameter (series 9, image 92). Posterior circulation: Severe stenosis of left fetal PCA and left P2 segment. Patent bilateral vertebral and basilar arteries. Patent right posterior cerebral artery. Venous sinuses: As permitted by contrast timing, patent. Anatomic variants: Patent right posterior communicating artery. Fetal left PCA. Delayed phase: No abnormal intracranial enhancement. Stable distribution of infarct in right anterior basal ganglia. Review of the MIP images confirms the above findings IMPRESSION: 1. Stable proximal right M1 occlusion with mid right M1 reconstitution. 2. Stable tandem left PCA segments of severe stenosis. Fetal left PCA. 3. 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 4. No new large vessel occlusion, high-grade stenosis, or aneurysm of the anterior or posterior intracranial circulation. 5. Patent carotid and vertebral arteries. No dissection, aneurysm, or significant stenosis by NASCET criteria is identified. Electronically Signed   By: Mitzi HansenLance  Furusawa-Stratton M.D.   On: 06/08/2017 23:41   Ct Head Wo Contrast  Result Date: 07-03-17 CLINICAL DATA:  Altered mental status. Unresponsive. Recent stroke. EXAM: CT HEAD WITHOUT CONTRAST TECHNIQUE: Contiguous axial images were obtained from the base of the skull through the vertex without intravenous contrast. COMPARISON:  Brain MRI 06/10/2017 FINDINGS: Brain: There is a subacute infarct involving the right basal ganglia and corona radiata, acute on subacute on the prior MRI. Edema anteriorly in the right basal ganglia has decreased compared to the prior MRI. A small amount of hyperattenuation along the  inferolateral greater than superomedial margins of the infarct may represent small volume blood products/confluent petechial hemorrhage, mineralization, or a combination of both. This corresponds to T1 hyperintensity on the prior MRI and has not significantly increased in volume. The infarct may have mildly expanded posteriorly at the corona radiata level compared to the prior MRI. Patchy low-density involving primarily white matter of the posterior right temporal and parietal lobes corresponds to evolving infarcts seen on the prior MRI. No acute infarct is identified in the left cerebral hemisphere or posterior fossa. There is a background of mild chronic small vessel ischemia in the cerebral white matter. No midline shift or extra-axial fluid collection is seen. Overall cerebral volume is within normal limits for age. Vascular: Calcified atherosclerosis at the skull base. No hyperdense vessel. Skull: No fracture or focal osseous lesion. Sinuses/Orbits: Paranasal sinuses and mastoid air cells are clear. Bilateral cataract extraction. Other: None. IMPRESSION: Evolving right basal ganglia infarct with decreased edema and small volume petechial blood products and/or mineralization as above. Likely mild posterior extension of infarct in the corona radiata compared to the prior MRI. Electronically Signed   By: Sebastian AcheAllen  Grady M.D.   On: 001-25-19 10:30   Ct Angio Neck W And/or Wo Contrast  Result Date: 06/08/2017 CLINICAL DATA:  84 y/o  F; code stroke for follow-up. EXAM: CT ANGIOGRAPHY HEAD AND NECK TECHNIQUE: Multidetector CT imaging of the head and neck was performed using the standard protocol during bolus administration of intravenous contrast. Multiplanar CT image reconstructions and MIPs were obtained to evaluate the vascular anatomy. Carotid stenosis measurements (when applicable) are obtained utilizing NASCET criteria, using the distal internal carotid diameter as the denominator. CONTRAST:  60mL ISOVUE-370  IOPAMIDOL (ISOVUE-370) INJECTION 76% COMPARISON:  06/08/2017 CT head. 06/04/2017  CT angiogram head and neck. FINDINGS: CTA NECK FINDINGS Aortic arch: Standard branching. Imaged portion shows no evidence of aneurysm or dissection. No significant stenosis of the major arch vessel origins. Mild calcific atherosclerosis. Right carotid system: No evidence of dissection, stenosis (50% or greater) or occlusion. Mild non stenotic calcific atherosclerosis of carotid siphon. Left carotid system: No evidence of dissection, stenosis (50% or greater) or occlusion. Mild non stenotic calcific atherosclerosis of carotid siphon. Severe stenosis of left external carotid origin. Vertebral arteries: Codominant. No evidence of dissection, stenosis (50% or greater) or occlusion. Motion related left vertebral artery origin. Skeleton: Moderate cervical spondylosis with prominent facet arthropathy. No high-grade canal stenosis. Other neck: Negative. Upper chest: Negative. Review of the MIP images confirms the above findings CTA HEAD FINDINGS Anterior circulation: Stable right proximal M1 occlusion within mid right M1 reconstitution. No new large vessel occlusion identified. Calcific atherosclerosis of carotid siphons without significant stenosis. Stable left ICA aneurysm of distal cavernous segment projecting superiorly and laterally measuring 4 mm to dome and 4 mm in diameter (series 9, image 92). Posterior circulation: Severe stenosis of left fetal PCA and left P2 segment. Patent bilateral vertebral and basilar arteries. Patent right posterior cerebral artery. Venous sinuses: As permitted by contrast timing, patent. Anatomic variants: Patent right posterior communicating artery. Fetal left PCA. Delayed phase: No abnormal intracranial enhancement. Stable distribution of infarct in right anterior basal ganglia. Review of the MIP images confirms the above findings IMPRESSION: 1. Stable proximal right M1 occlusion with mid right M1  reconstitution. 2. Stable tandem left PCA segments of severe stenosis. Fetal left PCA. 3. 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 4. No new large vessel occlusion, high-grade stenosis, or aneurysm of the anterior or posterior intracranial circulation. 5. Patent carotid and vertebral arteries. No dissection, aneurysm, or significant stenosis by NASCET criteria is identified. Electronically Signed   By: Mitzi Hansen M.D.   On: 06/08/2017 23:41   Mr Shirlee Latch ON Contrast  Result Date: 06/10/2017 CLINICAL DATA:  Initial evaluation for slurred speech with left-sided numbness, recent right basal ganglia infarct, now with new left facial droop, left leg weakness, and aphasia. EXAM: MRI HEAD WITHOUT CONTRAST MRA HEAD WITHOUT CONTRAST TECHNIQUE: Multiplanar, multiecho pulse sequences of the brain and surrounding structures were obtained without intravenous contrast. Angiographic images of the head were obtained using MRA technique without contrast. COMPARISON:  Prior CTA from 06/08/2017 as well as previous MRI from 06/04/2017. FINDINGS: MRI HEAD FINDINGS Brain: Previously identified right basal ganglia infarct is enlarged as compared to previous exam, likely reflecting acute on evolving subacute ischemia. Area of infarction now measures 2.5 x 3.6 cm in size. Patchy involvement of the adjacent subinsular white matter. Additionally, there are new scattered acute ischemic infarcts involving the deep white matter of the right corona radiata and centrum semi ovale as well as the right periatrial white matter. Few overlying subcentimeter cortical infarcts. These are somewhat in a watershed type distribution. Mild localized mass effect about the right basal ganglia infarct with partial attenuation of the adjacent right lateral ventricle. No associated hemorrhage. No other evidence for acute ischemia. Few scattered chronic micro hemorrhages again noted involving the bilateral cerebellum in right parietal  region. Stable cerebral volume. Mild for age chronic microvascular disease. No mass lesion or midline shift. No hydrocephalus. No extra-axial fluid collection. Major dural sinuses are patent. Pituitary gland suprasellar region normal. Midline structures intact and normal. Vascular: Patchy signal abnormality within knee cavernous right ICA with mid right M1 high-grade  stenosis in/lower partial occlusion grossly similar. Major intracranial vascular flow voids otherwise maintained. Skull and upper cervical spine: Craniocervical junction normal. Upper cervical spine within normal limits. Bone marrow signal intensity normal. No scalp soft tissue abnormality. Sinuses/Orbits: Globes oral soft tissues normal. Patient status post cataract extraction bilaterally. Paranasal sinuses remain largely clear. Small left mastoid effusion noted. Inner ear structures normal. Other: None. MRA HEAD FINDINGS ANTERIOR CIRCULATION: Study degraded by motion artifact. Distal cervical segments of the internal carotid artery is patent with antegrade flow. Petrous segments patent bilaterally. Cavernous and supraclinoid left ICA widely patent. Previously identified cavernous left ICA aneurysm measures approximately 4 mm in craniocaudad dimension, stable from previous when measured at similar level. Left ICA terminus widely patent. Cavernous and supraclinoid right ICA demonstrates scattered atheromatous irregularity in is mildly narrowed as compared to the left. A1 segments patent bilaterally. Anterior communicating artery not well assessed. Anterior cerebral arteries patent to their distal aspects without stenosis. Previously identified proximal right M1 occlusion and/ or high-grade/near occlusive stenosis with distal reconstitution again seen, relatively stable from previous (series 4, image 52). Patent but attenuated flow within the right MCA territory distally. Left M1 segment patent without stenosis. Severe proximal left M2 stenosis, middle  division noted, stable (Series 4, image 59). Distal left MCA branches demonstrate atheromatous irregularity but are patent to their distal aspects. POSTERIOR CIRCULATION: Vertebral arteries patent to the vertebrobasilar junction without stenosis. Patent right PICA. Left PICA not visualized. Basilar widely patent to its distal aspect. Superior cerebral arteries patent bilaterally. Right PCA supplied via the basilar and is well perfused to its distal aspect. Fetal type origin of the left PCA tandem severe left P2 stenoses again noted, grossly stable. IMPRESSION: MRI HEAD IMPRESSION: 1. Interval enlargement of right basal ganglia infarct, with additional multiple new acute right MCA territory infarcts as above, most of which are in a watershed type distribution. No associated hemorrhage. 2. Otherwise stable appearance of the brain. MRA HEAD IMPRESSION: 1. Stable appearance of the intracranial circulation with occlusion and/or severe near occlusive stenosis of the proximal right M1 segment with distal reconstitution and attenuated flow distally. 2. Severe near occlusive proximal left M2 stenosis, middle division, also stable. 3. Severe tandem left PCA stenoses, stable. 4. 4 mm cavernous left ICA aneurysm, unchanged. Electronically Signed   By: Rise Mu M.D.   On: 06/10/2017 01:33   Mr Brain Wo Contrast  Result Date: 06/10/2017 CLINICAL DATA:  Initial evaluation for slurred speech with left-sided numbness, recent right basal ganglia infarct, now with new left facial droop, left leg weakness, and aphasia. EXAM: MRI HEAD WITHOUT CONTRAST MRA HEAD WITHOUT CONTRAST TECHNIQUE: Multiplanar, multiecho pulse sequences of the brain and surrounding structures were obtained without intravenous contrast. Angiographic images of the head were obtained using MRA technique without contrast. COMPARISON:  Prior CTA from 06/08/2017 as well as previous MRI from 06/04/2017. FINDINGS: MRI HEAD FINDINGS Brain: Previously  identified right basal ganglia infarct is enlarged as compared to previous exam, likely reflecting acute on evolving subacute ischemia. Area of infarction now measures 2.5 x 3.6 cm in size. Patchy involvement of the adjacent subinsular white matter. Additionally, there are new scattered acute ischemic infarcts involving the deep white matter of the right corona radiata and centrum semi ovale as well as the right periatrial white matter. Few overlying subcentimeter cortical infarcts. These are somewhat in a watershed type distribution. Mild localized mass effect about the right basal ganglia infarct with partial attenuation of the adjacent right lateral ventricle. No associated  hemorrhage. No other evidence for acute ischemia. Few scattered chronic micro hemorrhages again noted involving the bilateral cerebellum in right parietal region. Stable cerebral volume. Mild for age chronic microvascular disease. No mass lesion or midline shift. No hydrocephalus. No extra-axial fluid collection. Major dural sinuses are patent. Pituitary gland suprasellar region normal. Midline structures intact and normal. Vascular: Patchy signal abnormality within knee cavernous right ICA with mid right M1 high-grade stenosis in/lower partial occlusion grossly similar. Major intracranial vascular flow voids otherwise maintained. Skull and upper cervical spine: Craniocervical junction normal. Upper cervical spine within normal limits. Bone marrow signal intensity normal. No scalp soft tissue abnormality. Sinuses/Orbits: Globes oral soft tissues normal. Patient status post cataract extraction bilaterally. Paranasal sinuses remain largely clear. Small left mastoid effusion noted. Inner ear structures normal. Other: None. MRA HEAD FINDINGS ANTERIOR CIRCULATION: Study degraded by motion artifact. Distal cervical segments of the internal carotid artery is patent with antegrade flow. Petrous segments patent bilaterally. Cavernous and supraclinoid  left ICA widely patent. Previously identified cavernous left ICA aneurysm measures approximately 4 mm in craniocaudad dimension, stable from previous when measured at similar level. Left ICA terminus widely patent. Cavernous and supraclinoid right ICA demonstrates scattered atheromatous irregularity in is mildly narrowed as compared to the left. A1 segments patent bilaterally. Anterior communicating artery not well assessed. Anterior cerebral arteries patent to their distal aspects without stenosis. Previously identified proximal right M1 occlusion and/ or high-grade/near occlusive stenosis with distal reconstitution again seen, relatively stable from previous (series 4, image 52). Patent but attenuated flow within the right MCA territory distally. Left M1 segment patent without stenosis. Severe proximal left M2 stenosis, middle division noted, stable (Series 4, image 59). Distal left MCA branches demonstrate atheromatous irregularity but are patent to their distal aspects. POSTERIOR CIRCULATION: Vertebral arteries patent to the vertebrobasilar junction without stenosis. Patent right PICA. Left PICA not visualized. Basilar widely patent to its distal aspect. Superior cerebral arteries patent bilaterally. Right PCA supplied via the basilar and is well perfused to its distal aspect. Fetal type origin of the left PCA tandem severe left P2 stenoses again noted, grossly stable. IMPRESSION: MRI HEAD IMPRESSION: 1. Interval enlargement of right basal ganglia infarct, with additional multiple new acute right MCA territory infarcts as above, most of which are in a watershed type distribution. No associated hemorrhage. 2. Otherwise stable appearance of the brain. MRA HEAD IMPRESSION: 1. Stable appearance of the intracranial circulation with occlusion and/or severe near occlusive stenosis of the proximal right M1 segment with distal reconstitution and attenuated flow distally. 2. Severe near occlusive proximal left M2  stenosis, middle division, also stable. 3. Severe tandem left PCA stenoses, stable. 4. 4 mm cavernous left ICA aneurysm, unchanged. Electronically Signed   By: Rise Mu M.D.   On: 06/10/2017 01:33   Dg Chest Port 1 View  Result Date: 06/27/2017 CLINICAL DATA:  Altered mental status.  Code sepsis. EXAM: PORTABLE CHEST 1 VIEW COMPARISON:  06/10/2017 FINDINGS: Mild cardiac enlargement. Unchanged left pleural effusion. No airspace opacities or atelectasis. IMPRESSION: 1. Unchanged left pleural effusion. Electronically Signed   By: Signa Kell M.D.   On: 07/07/2017 10:26   Dg Chest Port 1 View  Result Date: 06/10/2017 CLINICAL DATA:  Confusion EXAM: PORTABLE CHEST 1 VIEW COMPARISON:  06/04/2017 FINDINGS: Cardiac shadow is stable. Small left-sided pleural effusion is seen. The lungs are otherwise clear. No bony abnormality is noted. IMPRESSION: Small left pleural effusion. Electronically Signed   By: Alcide Clever M.D.   On: 06/10/2017  10:17   Dg Swallowing Func-speech Pathology  Result Date: 06/10/2017 Objective Swallowing Evaluation: Type of Study: MBS-Modified Barium Swallow Study  Patient Details Name: Lindsey Rivera MRN: [id number] Date of Birth: 12-04-1924 Today's Date: 06/10/2017 Time: SLP Start Time (ACUTE ONLY): 1335 -SLP Stop Time (ACUTE ONLY): 1355 SLP Time Calculation (min) (ACUTE ONLY): 20 min Past Medical History: Past Medical History: Diagnosis Date . Arthritis  . Stroke (HCC)  . Thyroid disease  Past Surgical History: Past Surgical History: Procedure Laterality Date . ABDOMINAL HYSTERECTOMY   HPI: Patient is a 84 y.o. female with recent admission for slurred speech and left-sided numbness from right basal ganglia CVA. She was discharged on 12/29. MRI shows Interval enlargement of right basal ganglia infarct, with additional multiple new acute right MCA territory infarcts deep white matter of the right corona radiata and centrum semi ovale as well as the right periatrial white matter.  Few overlying subcentimeter cortical infarcts, most of which are in a watershed type distribution.  In prior evaluaions pt demonstrated cognitive linguistic deficits including attention, memory, organization, language (word finding, naming, word fluency), and intellectual awareness. Communication described as language of confusion, tangential and perseverative. Son at bedside reported that pts function was not far from baseline.  Subjective: pleasant, expressive aphasia with some awareness Assessment / Plan / Recommendation [facility] IP CLINICAL IMPRESSIONS 06/10/2017 Clinical Impression Pt demonstrates a mild oral dysphagia with slow bolus formation and transit, marked with solids, requiring a liquid wash to fully transit bolus. Pts function is impacted during study by lethargy and cognitive deficits. Pt is unable to follow most tactile or verbal cues. Swallow initaition is intermittently delayed resulting in silent aspiration of thin liquids before the swallow and infrequent sensed penetration of nectar thick liquids with immediate ejection. Recommend pt consume only pureed solids until arousal improves, may continue nectar thick liquids. May benefit from retest when consistently alert.  SLP Visit Diagnosis Dysphagia, oropharyngeal phase (R13.12) Attention and concentration deficit following Cerebral infarction Frontal lobe and executive function deficit following -- Impact on safety and function Moderate aspiration risk   [facility] IP TREATMENT RECOMMENDATION 06/10/2017 Treatment Recommendations Therapy as outlined in treatment plan below   Prognosis 06/10/2017 Prognosis for Safe Diet Advancement Good Barriers to Reach Goals -- Barriers/Prognosis Comment -- [facility] IP DIET RECOMMENDATION 06/10/2017 SLP Diet Recommendations Dysphagia 1 (Puree) solids;Nectar thick liquid Liquid Administration via Cup;Straw Medication Administration Whole meds with puree Compensations Slow rate;Small sips/bites;Minimize environmental distractions;Clear  throat intermittently Postural Changes Remain semi-upright after after feeds/meals (Comment)   [facility] IP OTHER RECOMMENDATIONS 06/10/2017 Recommended Consults -- Oral Care Recommendations Oral care BID Other Recommendations Order thickener from pharmacy;Clarify dietary restrictions;Prohibited food (jello, ice cream, thin soups);Remove water pitcher   [facility] IP FOLLOW UP RECOMMENDATIONS 06/10/2017 Follow up Recommendations Skilled Nursing facility   [facility] IP FREQUENCY AND DURATION 06/10/2017 Speech Therapy Frequency (ACUTE ONLY) min 2x/week Treatment Duration 2 weeks      [facility] IP ORAL PHASE 06/10/2017 Oral Phase Impaired Oral - Pudding Teaspoon -- Oral - Pudding Cup -- Oral - Honey Teaspoon -- Oral - Honey Cup -- Oral - Nectar Teaspoon -- Oral - Nectar Cup Decreased bolus cohesion;Delayed oral transit Oral - Nectar Straw Other (Comment) Oral - Thin Teaspoon -- Oral - Thin Cup Decreased bolus cohesion;Delayed oral transit Oral - Thin Straw -- Oral - Puree Decreased bolus cohesion;Delayed oral transit Oral - Mech Soft Decreased bolus cohesion;Delayed oral transit;Lingual/palatal residue;Incomplete tongue to palate contact;Reduced posterior propulsion Oral - Regular -- Oral - Multi-Consistency -- Oral -  Pill -- Oral Phase - Comment --  CHL IP PHARYNGEAL PHASE 06/10/2017 Pharyngeal Phase Impaired Pharyngeal- Pudding Teaspoon -- Pharyngeal -- Pharyngeal- Pudding Cup -- Pharyngeal -- Pharyngeal- Honey Teaspoon -- Pharyngeal -- Pharyngeal- Honey Cup -- Pharyngeal -- Pharyngeal- Nectar Teaspoon -- Pharyngeal -- Pharyngeal- Nectar Cup Penetration/Aspiration before swallow Pharyngeal Material enters airway, remains ABOVE vocal cords then ejected out;Material enters airway, CONTACTS cords and then ejected out;Material does not enter airway Pharyngeal- Nectar Straw -- Pharyngeal -- Pharyngeal- Thin Teaspoon -- Pharyngeal -- Pharyngeal- Thin Cup Delayed swallow initiation-pyriform sinuses;Penetration/Aspiration before swallow Pharyngeal Material  enters airway, passes BELOW cords without attempt by patient to eject out (silent aspiration);Material enters airway, CONTACTS cords and not ejected out Pharyngeal- Thin Straw -- Pharyngeal -- Pharyngeal- Puree Delayed swallow initiation-vallecula Pharyngeal -- Pharyngeal- Mechanical Soft Delayed swallow initiation-vallecula Pharyngeal -- Pharyngeal- Regular -- Pharyngeal -- Pharyngeal- Multi-consistency -- Pharyngeal -- Pharyngeal- Pill -- Pharyngeal -- Pharyngeal Comment --  No flowsheet data found. No flowsheet data found. Harlon Ditty, MA CCC-SLP [phone] [place], Riley Nearing 06/10/2017, 2:12 PM              Ct Head Code Stroke Wo Contrast  Result Date: 06/08/2017 CLINICAL DATA:  Code stroke. Left facial droop and left arm weakness. Code stroke. Recent infarct. Progressive symptoms. EXAM: CT HEAD WITHOUT CONTRAST TECHNIQUE: Contiguous axial images were obtained from the base of the skull through the vertex without intravenous contrast. COMPARISON:  CT head without contrast 06/04/2017. CTA head and neck and MRI head 06/04/2017. FINDINGS: Brain: The previous scratched at the recent nonhemorrhagic infarct involving many anterior basal ganglia on the right demonstrates expected evolution. The insular cortex is intact. The posterior basal ganglia are within normal limits. No other acute or focal cortical abnormalities are present. White matter changes are stable. There is no hemorrhage into the infarct. The brainstem and cerebellum are stable. Atrophy is unchanged. There is progressive effacement of the ventral horn of the right lateral ventricle compatible with edema associated with evolving infarct. The ventricles are otherwise stable. No significant extra-axial fluid collection is present. Vascular: Atherosclerotic calcifications are again seen within the cavernous internal carotid artery is bilaterally. There is no hyperdense vessel. Skull: Calvarium is intact. No focal lytic or blastic lesions are  present. Sinuses/Orbits: The paranasal sinuses and mastoid air cells are clear. Bilateral lens replacements are present. Globes and orbits are within normal limits. Scratched ASPECTS [facility] Stroke Program Early CT Score) - Ganglionic level infarction (caudate, lentiform nuclei, internal capsule, insula, M1-M3 cortex): 7/7 - Supraganglionic infarction (M4-M6 cortex): 3/3 Total score (0-10 with 10 being normal): 10/10 IMPRESSION: 1. Expected evolution of recent infarct involving the anterior right basal ganglia. 2. No new infarct. 3. Otherwise stable atrophy and white matter disease. 4. ASPECTS is 10/10 These results were called by telephone at the time of interpretation on 06/08/2017 at 9:49 pm to Dr. Bethann Berkshire , who verbally acknowledged these results. Electronically Signed   By: Marin Roberts M.D.   On: 06/08/2017 21:51    Microbiology Recent Results (from the past 240 hour(s))  Blood Culture (routine x 2)     Status: None (Preliminary result)   Collection Time: 06/10/2017  9:40 AM  Result Value Ref Range Status   Specimen Description BLOOD LEFT ANTECUBITAL  Final   Special Requests   Final    BOTTLES DRAWN AEROBIC AND ANAEROBIC Blood Culture adequate volume   Culture NO GROWTH 2 DAYS  Final   Report Status PENDING  Incomplete  Blood Culture (routine  x 2)     Status: None (Preliminary result)   Collection Time: 2017/07/30  9:50 AM  Result Value Ref Range Status   Specimen Description BLOOD RIGHT ANTECUBITAL  Final   Special Requests   Final    BOTTLES DRAWN AEROBIC AND ANAEROBIC Blood Culture adequate volume   Culture NO GROWTH 2 DAYS  Final   Report Status PENDING  Incomplete    Lab Basic Metabolic Panel: Recent Labs  Lab July 30, 2017 0948 07-30-2017 1004  NA 164* 165*  K 5.3* 5.2*  CL 124* 133*  CO2 17*  --   GLUCOSE 226* 223*  BUN 234* >140*  CREATININE 6.61* 6.60*  CALCIUM 10.4*  --    Liver Function Tests: Recent Labs  Lab Jul 30, 2017 0948  AST 43*  ALT 66*  ALKPHOS  145*  BILITOT 1.4*  PROT 7.3  ALBUMIN 3.4*   No results for input(s): LIPASE, AMYLASE in the last 168 hours. No results for input(s): AMMONIA in the last 168 hours. CBC: Recent Labs  Lab July 30, 2017 0948 07/30/17 1004  WBC 28.5*  --   NEUTROABS 26.5*  --   HGB 17.0* 17.0*  HCT 54.8* 50.0*  MCV 100.7*  --   PLT PLATELET CLUMPS NOTED ON SMEAR, COUNT APPEARS ADEQUATE  --    Cardiac Enzymes: No results for input(s): CKTOTAL, CKMB, CKMBINDEX, TROPONINI in the last 168 hours. Sepsis Labs: Recent Labs  Lab 07-30-17 0948 Jul 30, 2017 1004 Jul 30, 2017 1205  WBC 28.5*  --   --   LATICACIDVEN  --  3.80* 2.32*    Procedures/Operations  None   Hazeline Junker, MD 06/25/2017, 7:41 AM

## 2017-07-10 NOTE — Progress Notes (Signed)
Body with attached Death Certificate transported to morgue and Patient Placement notfied.

## 2017-07-10 NOTE — Progress Notes (Signed)
[facility].  CNA now cleaning and preparing body to send to morgue.  Still awaiting death certificate but [facility] floor coverage on the unit.

## 2017-07-10 NOTE — Progress Notes (Addendum)
Patient passed away around 0454 which was confirmed by Neill Loft. Estrera, RN and with son Lindsey Rivera and her wife at bedside. On call [facility] floor coverage was made aware and [facility] called for referral and patient not a donor candidate.  Patient's son and his wife still at bedside to pay their last respect..Marland Kitchen

## 2017-07-10 DEATH — deceased

## 2017-07-29 DIAGNOSIS — Z515 Encounter for palliative care: Secondary | ICD-10-CM

## 2017-07-29 DIAGNOSIS — E870 Hyperosmolality and hypernatremia: Secondary | ICD-10-CM

## 2017-08-18 ENCOUNTER — Ambulatory Visit: Payer: Medicare Other | Admitting: Neurology

## 2019-04-27 IMAGING — CT CT ANGIO HEAD
1 of 8 series · 3 of 35 positions shown · IV contrast (isovue)
Comparison: CT HEAD June 04, 2017 at 8818 hours and MRI of the
head June 04, 2017

ADDENDUM:
Critical Value/emergent results were called by telephone at the time
of interpretation on 06/04/2017 at [DATE] to Dr. [REDACTED], who verbally acknowledged these results.

CONTRAST:  50 cc Isovue 370
CLINICAL DATA: Follow-up stroke.
EXAM:
CT ANGIOGRAPHY HEAD AND NECK
TECHNIQUE: Multidetector CT imaging of the head and neck was performed using
the standard protocol during bolus administration of intravenous
contrast. Multiplanar CT image reconstructions and MIPs were
obtained to evaluate the vascular anatomy. Carotid stenosis
measurements (when applicable) are obtained utilizing NASCET
criteria, using the distal internal carotid diameter as the
denominator.
CONTRAST:  <See Chart> [id number] IOPAMIDOL ([id number]-[id number]) INJECTION
76%

[Series 5: cta neck/head f_0.6 · axial · 0.52mm/px · z∈[-283,-121]mm · 3 of 217 slices shown]
[im 55/217  soft-tissue]
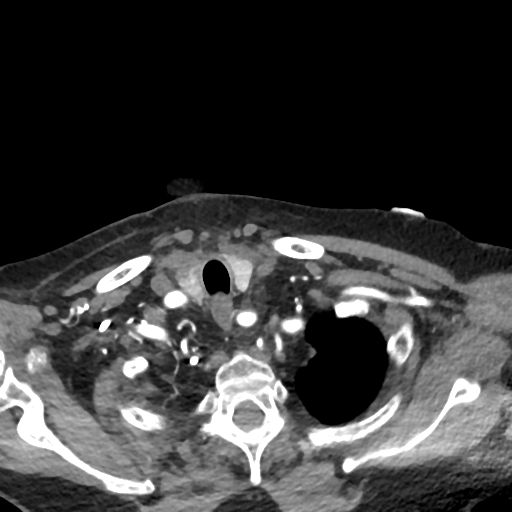
[im 109/217  bone]
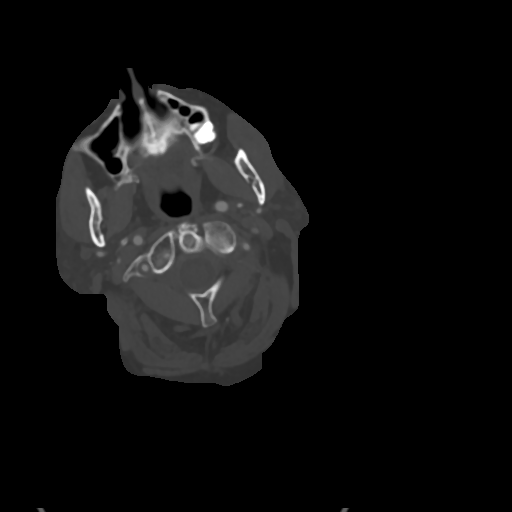
[im 163/217  soft-tissue]
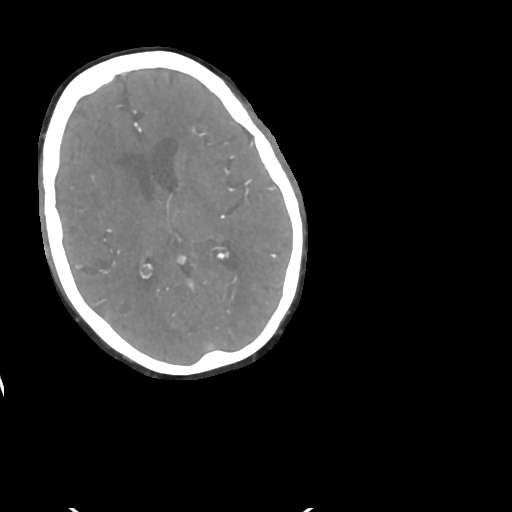

[3 of 35 positions shown; findings below may reference images not displayed]

FINDINGS: CTA NECK

AORTIC ARCH: Normal appearance of the thoracic arch, normal branch
pattern. Mild calcific atherosclerosis aortic arch. The origins of
the innominate, left Common carotid artery and subclavian artery are
widely patent. Moderate stenosis in intimal thickening included
RIGHT subclavian artery with suspected severe axillary artery
stenosis.

RIGHT CAROTID SYSTEM: Common carotid artery is widely patent,
retropharyngeal course. Mild calcific atherosclerosis carotid
bifurcation without hemodynamically significant stenosis by NASCET
criteria. Mild calcific atherosclerosis RIGHT internal carotid
artery which is patent and non stenotic.

LEFT CAROTID SYSTEM: Common carotid artery is widely patent,
retropharyngeal course. Mild calcific atherosclerosis carotid
bifurcation without hemodynamically significant stenosis by NASCET
criteria. Mild luminal irregularity most compatible with
atherosclerosis.

VERTEBRAL ARTERIES:Codominant vertebral artery's. Normal appearance
of the vertebral arteries, widely patent.

SKELETON: No acute osseous process though bone windows have not been
submitted. RIGHT shoulder loose body.

OTHER NECK: Soft tissues of the neck are nonacute though, not
tailored for evaluation. Moderate cervical spondylosis, severe LEFT
facet arthropathy.

UPPER CHEST: Included lung apices are clear. No superior mediastinal
lymphadenopathy.

CTA HEAD

ANTERIOR CIRCULATION: Patent cervical internal carotid arteries,
petrous, cavernous and supra clinoid internal carotid arteries.
Moderate stenosis RIGHT anterior genu. RIGHT proximal M1 near
complete occlusion with patent distal RIGHT M1 segment distally.
Patent anterior communicating artery. Patent anterior and LEFT
cerebral arteries.

No large vessel occlusion, significant stenosis, contrast
extravasation or aneurysm.

POSTERIOR CIRCULATION: Patent vertebral arteries, vertebrobasilar
junction and basilar artery, as well as main branch vessels. Patent
posterior cerebral arteries. LEFT posterior communicating artery
present with severe stenosis posteriorly, severely stenotic LEFT P1
segment and in tandem severe stenosis LEFT posterior cerebral artery
from P2 segment distally. Moderate stenosis distal RIGHT P2 segment.

No large vessel occlusion, significant stenosis, contrast
extravasation or aneurysm.

VENOUS SINUSES: Major dural venous sinuses are patent though not
tailored for evaluation on this angiographic examination.

ANATOMIC VARIANTS: None.

DELAYED PHASE: No abnormal intracranial enhancement. Evolving acute
RIGHT basal ganglia infarct.

MIP images reviewed.
IMPRESSION: CTA NECK:

1. Atherosclerosis without hemodynamically significant stenosis or
acute vascular process in the neck.
2. Moderate stenosis RIGHT subclavian artery and partially imaged
probable severe stenosis RIGHT axillary artery.
CTA HEAD:

1. Emergent proximal RIGHT M1 occlusion with mid RIGHT M1
reconstitution/collateralization.
2. Tandem severe stenosis LEFT posterior cerebral artery.
